# Patient Record
Sex: Female | Born: 1955
Health system: Southern US, Community
[De-identification: ages and names within clinical notes are randomized; demographics above are authoritative.]

## PROBLEM LIST (undated history)

## (undated) DIAGNOSIS — F4310 Post-traumatic stress disorder, unspecified: Secondary | ICD-10-CM

## (undated) DIAGNOSIS — N200 Calculus of kidney: Secondary | ICD-10-CM

## (undated) DIAGNOSIS — F419 Anxiety disorder, unspecified: Secondary | ICD-10-CM

## (undated) DIAGNOSIS — K219 Gastro-esophageal reflux disease without esophagitis: Secondary | ICD-10-CM

## (undated) DIAGNOSIS — F329 Major depressive disorder, single episode, unspecified: Secondary | ICD-10-CM

## (undated) DIAGNOSIS — G43909 Migraine, unspecified, not intractable, without status migrainosus: Secondary | ICD-10-CM

## (undated) DIAGNOSIS — I10 Essential (primary) hypertension: Secondary | ICD-10-CM

## (undated) DIAGNOSIS — J449 Chronic obstructive pulmonary disease, unspecified: Secondary | ICD-10-CM

## (undated) DIAGNOSIS — E119 Type 2 diabetes mellitus without complications: Secondary | ICD-10-CM

## (undated) HISTORY — DX: Post-traumatic stress disorder, unspecified: F43.10

## (undated) HISTORY — PX: KNEE ARTHROSCOPY: SUR90

## (undated) HISTORY — DX: Major depressive disorder, single episode, unspecified: F32.9

## (undated) HISTORY — PX: TONSILLECTOMY: SUR1361

## (undated) HISTORY — PX: CARPAL TUNNEL RELEASE: SHX101

## (undated) HISTORY — DX: Chronic obstructive pulmonary disease, unspecified: J44.9

## (undated) HISTORY — DX: Migraine, unspecified, not intractable, without status migrainosus: G43.909

## (undated) HISTORY — DX: Essential (primary) hypertension: I10

## (undated) HISTORY — DX: Type 2 diabetes mellitus without complications: E11.9

## (undated) HISTORY — PX: BREAST CYST EXCISION: SHX579

## (undated) HISTORY — DX: Anxiety disorder, unspecified: F41.9

## (undated) HISTORY — DX: Gastro-esophageal reflux disease without esophagitis: K21.9

---
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
## 1992-01-03 DIAGNOSIS — K219 Gastro-esophageal reflux disease without esophagitis: Secondary | ICD-10-CM

## 1992-01-03 HISTORY — DX: Gastro-esophageal reflux disease without esophagitis: K21.9

## 1998-07-01 ENCOUNTER — Observation Stay (HOSPITAL_COMMUNITY): Admission: EM | Admit: 1998-07-01 | Discharge: 1998-07-02 | Payer: Self-pay | Admitting: Emergency Medicine

## 1998-07-20 ENCOUNTER — Ambulatory Visit (HOSPITAL_COMMUNITY): Admission: RE | Admit: 1998-07-20 | Discharge: 1998-07-20 | Payer: Self-pay | Admitting: *Deleted

## 1998-07-20 ENCOUNTER — Encounter: Payer: Self-pay | Admitting: *Deleted

## 1998-11-18 ENCOUNTER — Encounter: Payer: Self-pay | Admitting: Obstetrics and Gynecology

## 1998-11-18 ENCOUNTER — Encounter: Admission: RE | Admit: 1998-11-18 | Discharge: 1998-11-18 | Payer: Self-pay | Admitting: Obstetrics and Gynecology

## 1999-10-28 ENCOUNTER — Encounter: Admission: RE | Admit: 1999-10-28 | Discharge: 1999-10-28 | Payer: Self-pay | Admitting: Family Medicine

## 1999-10-28 ENCOUNTER — Encounter: Payer: Self-pay | Admitting: Family Medicine

## 2000-05-29 ENCOUNTER — Encounter: Admission: RE | Admit: 2000-05-29 | Discharge: 2000-05-29 | Payer: Self-pay | Admitting: Family Medicine

## 2000-05-29 ENCOUNTER — Encounter: Payer: Self-pay | Admitting: Family Medicine

## 2000-08-02 ENCOUNTER — Encounter: Payer: Self-pay | Admitting: Obstetrics and Gynecology

## 2000-08-02 ENCOUNTER — Encounter: Admission: RE | Admit: 2000-08-02 | Discharge: 2000-08-02 | Payer: Self-pay | Admitting: Obstetrics and Gynecology

## 2000-12-02 ENCOUNTER — Encounter: Payer: Self-pay | Admitting: Emergency Medicine

## 2000-12-02 ENCOUNTER — Emergency Department (HOSPITAL_COMMUNITY): Admission: EM | Admit: 2000-12-02 | Discharge: 2000-12-03 | Payer: Self-pay | Admitting: Emergency Medicine

## 2002-12-05 ENCOUNTER — Encounter: Admission: RE | Admit: 2002-12-05 | Discharge: 2002-12-05 | Payer: Self-pay | Admitting: Obstetrics and Gynecology

## 2004-01-25 ENCOUNTER — Encounter: Admission: RE | Admit: 2004-01-25 | Discharge: 2004-01-25 | Payer: Self-pay | Admitting: Family Medicine

## 2004-11-08 ENCOUNTER — Other Ambulatory Visit: Admission: RE | Admit: 2004-11-08 | Discharge: 2004-11-08 | Payer: Self-pay | Admitting: Obstetrics and Gynecology

## 2005-01-16 ENCOUNTER — Ambulatory Visit (HOSPITAL_BASED_OUTPATIENT_CLINIC_OR_DEPARTMENT_OTHER): Admission: RE | Admit: 2005-01-16 | Discharge: 2005-01-16 | Payer: Self-pay | Admitting: Urology

## 2005-03-09 ENCOUNTER — Ambulatory Visit (HOSPITAL_BASED_OUTPATIENT_CLINIC_OR_DEPARTMENT_OTHER): Admission: RE | Admit: 2005-03-09 | Discharge: 2005-03-09 | Payer: Self-pay | Admitting: Orthopedic Surgery

## 2005-06-09 ENCOUNTER — Ambulatory Visit (HOSPITAL_BASED_OUTPATIENT_CLINIC_OR_DEPARTMENT_OTHER): Admission: RE | Admit: 2005-06-09 | Discharge: 2005-06-09 | Payer: Self-pay | Admitting: Orthopedic Surgery

## 2005-06-14 ENCOUNTER — Encounter: Admission: RE | Admit: 2005-06-14 | Discharge: 2005-06-14 | Payer: Self-pay | Admitting: Family Medicine

## 2005-11-09 ENCOUNTER — Ambulatory Visit (HOSPITAL_COMMUNITY): Admission: RE | Admit: 2005-11-09 | Discharge: 2005-11-09 | Payer: Self-pay | Admitting: Family Medicine

## 2005-11-27 ENCOUNTER — Ambulatory Visit: Payer: Self-pay | Admitting: Critical Care Medicine

## 2005-12-06 ENCOUNTER — Other Ambulatory Visit: Admission: RE | Admit: 2005-12-06 | Discharge: 2005-12-06 | Payer: Self-pay | Admitting: Obstetrics & Gynecology

## 2005-12-11 ENCOUNTER — Ambulatory Visit: Payer: Self-pay | Admitting: Gastroenterology

## 2005-12-29 ENCOUNTER — Ambulatory Visit: Payer: Self-pay | Admitting: Critical Care Medicine

## 2006-01-02 DIAGNOSIS — F32A Depression, unspecified: Secondary | ICD-10-CM

## 2006-01-02 DIAGNOSIS — G43909 Migraine, unspecified, not intractable, without status migrainosus: Secondary | ICD-10-CM

## 2006-01-02 HISTORY — DX: Depression, unspecified: F32.A

## 2006-01-02 HISTORY — DX: Migraine, unspecified, not intractable, without status migrainosus: G43.909

## 2006-04-06 ENCOUNTER — Ambulatory Visit (HOSPITAL_BASED_OUTPATIENT_CLINIC_OR_DEPARTMENT_OTHER): Admission: RE | Admit: 2006-04-06 | Discharge: 2006-04-06 | Payer: Self-pay | Admitting: Orthopedic Surgery

## 2006-04-09 ENCOUNTER — Ambulatory Visit: Payer: Self-pay | Admitting: Internal Medicine

## 2006-04-19 ENCOUNTER — Encounter (INDEPENDENT_AMBULATORY_CARE_PROVIDER_SITE_OTHER): Payer: Self-pay | Admitting: Specialist

## 2006-04-19 ENCOUNTER — Ambulatory Visit: Payer: Self-pay | Admitting: Internal Medicine

## 2006-11-06 ENCOUNTER — Ambulatory Visit (HOSPITAL_BASED_OUTPATIENT_CLINIC_OR_DEPARTMENT_OTHER): Admission: RE | Admit: 2006-11-06 | Discharge: 2006-11-06 | Payer: Self-pay | Admitting: Orthopedic Surgery

## 2006-11-07 ENCOUNTER — Other Ambulatory Visit: Admission: RE | Admit: 2006-11-07 | Discharge: 2006-11-07 | Payer: Self-pay | Admitting: Obstetrics & Gynecology

## 2007-01-01 ENCOUNTER — Encounter: Admission: RE | Admit: 2007-01-01 | Discharge: 2007-01-01 | Payer: Self-pay | Admitting: Surgery

## 2007-01-09 ENCOUNTER — Encounter: Admission: RE | Admit: 2007-01-09 | Discharge: 2007-01-09 | Payer: Self-pay | Admitting: Obstetrics and Gynecology

## 2007-12-17 ENCOUNTER — Other Ambulatory Visit: Admission: RE | Admit: 2007-12-17 | Discharge: 2007-12-17 | Payer: Self-pay | Admitting: Obstetrics and Gynecology

## 2007-12-24 ENCOUNTER — Encounter: Admission: RE | Admit: 2007-12-24 | Discharge: 2007-12-24 | Payer: Self-pay | Admitting: Obstetrics and Gynecology

## 2008-01-06 ENCOUNTER — Encounter: Admission: RE | Admit: 2008-01-06 | Discharge: 2008-01-06 | Payer: Self-pay | Admitting: Family Medicine

## 2008-01-19 ENCOUNTER — Encounter: Admission: RE | Admit: 2008-01-19 | Discharge: 2008-01-19 | Payer: Self-pay | Admitting: Obstetrics and Gynecology

## 2008-12-19 ENCOUNTER — Emergency Department (HOSPITAL_COMMUNITY): Admission: EM | Admit: 2008-12-19 | Discharge: 2008-12-20 | Payer: Self-pay | Admitting: Emergency Medicine

## 2008-12-20 ENCOUNTER — Emergency Department (HOSPITAL_COMMUNITY): Admission: EM | Admit: 2008-12-20 | Discharge: 2008-12-21 | Payer: Self-pay | Admitting: Emergency Medicine

## 2009-01-02 DIAGNOSIS — J449 Chronic obstructive pulmonary disease, unspecified: Secondary | ICD-10-CM

## 2009-01-02 HISTORY — DX: Chronic obstructive pulmonary disease, unspecified: J44.9

## 2009-01-06 ENCOUNTER — Emergency Department (HOSPITAL_COMMUNITY): Admission: EM | Admit: 2009-01-06 | Discharge: 2009-01-06 | Payer: Self-pay | Admitting: Emergency Medicine

## 2009-01-07 ENCOUNTER — Encounter: Admission: RE | Admit: 2009-01-07 | Discharge: 2009-01-07 | Payer: Self-pay | Admitting: Family Medicine

## 2009-01-13 ENCOUNTER — Emergency Department (HOSPITAL_COMMUNITY): Admission: EM | Admit: 2009-01-13 | Discharge: 2009-01-13 | Payer: Self-pay | Admitting: Emergency Medicine

## 2009-04-07 ENCOUNTER — Ambulatory Visit: Payer: Self-pay | Admitting: Family Medicine

## 2009-04-07 LAB — CONVERTED CEMR LAB
ALT: 27 units/L (ref 0–35)
AST: 23 units/L (ref 0–37)
Albumin: 4.4 g/dL (ref 3.5–5.2)
Alkaline Phosphatase: 83 units/L (ref 39–117)
BUN: 13 mg/dL (ref 6–23)
Basophils Absolute: 0 10*3/uL (ref 0.0–0.1)
Basophils Relative: 0 % (ref 0–1)
CO2: 27 meq/L (ref 19–32)
Calcium: 9.7 mg/dL (ref 8.4–10.5)
Chloride: 102 meq/L (ref 96–112)
Cholesterol: 186 mg/dL (ref 0–200)
Creatinine, Ser: 0.55 mg/dL (ref 0.40–1.20)
Eosinophils Absolute: 0.3 10*3/uL (ref 0.0–0.7)
Eosinophils Relative: 3 % (ref 0–5)
Glucose, Bld: 82 mg/dL (ref 70–99)
HCT: 44.5 % (ref 36.0–46.0)
HDL: 35 mg/dL — ABNORMAL LOW (ref >39)
Hemoglobin: 14.5 g/dL (ref 12.0–15.0)
LDL Cholesterol: 101 mg/dL — ABNORMAL HIGH (ref 0–99)
Lymphocytes Relative: 31 % (ref 12–46)
Lymphs Abs: 3.9 10*3/uL (ref 0.7–4.0)
MCHC: 32.6 g/dL (ref 30.0–36.0)
MCV: 98.2 fL (ref 78.0–100.0)
Monocytes Absolute: 0.8 10*3/uL (ref 0.1–1.0)
Monocytes Relative: 6 % (ref 3–12)
Neutro Abs: 7.7 10*3/uL (ref 1.7–7.7)
Neutrophils Relative %: 60 % (ref 43–77)
Platelets: 326 10*3/uL (ref 150–400)
Potassium: 4.4 meq/L (ref 3.5–5.3)
RBC: 4.53 M/uL (ref 3.87–5.11)
RDW: 14.2 % (ref 11.5–15.5)
Sodium: 143 meq/L (ref 135–145)
TSH: 1.615 microintl units/mL (ref 0.350–4.500)
Total Bilirubin: 0.5 mg/dL (ref 0.3–1.2)
Total CHOL/HDL Ratio: 5.3
Total Protein: 7.2 g/dL (ref 6.0–8.3)
Triglycerides: 250 mg/dL — ABNORMAL HIGH (ref <150)
VLDL: 50 mg/dL — ABNORMAL HIGH (ref 0–40)
Vit D, 25-Hydroxy: 39 ng/mL (ref 30–89)
WBC: 12.7 10*3/uL — ABNORMAL HIGH (ref 4.0–10.5)

## 2009-04-26 ENCOUNTER — Ambulatory Visit: Payer: Self-pay | Admitting: Internal Medicine

## 2009-04-28 ENCOUNTER — Ambulatory Visit: Payer: Self-pay | Admitting: Internal Medicine

## 2009-05-06 ENCOUNTER — Ambulatory Visit (HOSPITAL_COMMUNITY): Admission: RE | Admit: 2009-05-06 | Discharge: 2009-05-06 | Payer: Self-pay | Admitting: Family Medicine

## 2009-05-24 ENCOUNTER — Ambulatory Visit: Payer: Self-pay | Admitting: Internal Medicine

## 2009-06-08 ENCOUNTER — Ambulatory Visit: Payer: Self-pay | Admitting: Internal Medicine

## 2009-06-23 ENCOUNTER — Ambulatory Visit (HOSPITAL_COMMUNITY): Admission: RE | Admit: 2009-06-23 | Discharge: 2009-06-23 | Payer: Self-pay | Admitting: Family Medicine

## 2009-07-13 ENCOUNTER — Ambulatory Visit: Payer: Self-pay | Admitting: Family Medicine

## 2009-07-13 ENCOUNTER — Ambulatory Visit: Payer: Self-pay | Admitting: Internal Medicine

## 2009-08-10 ENCOUNTER — Ambulatory Visit: Payer: Self-pay | Admitting: Internal Medicine

## 2009-12-18 ENCOUNTER — Emergency Department (HOSPITAL_COMMUNITY)
Admission: EM | Admit: 2009-12-18 | Discharge: 2009-12-18 | Payer: Self-pay | Source: Home / Self Care | Admitting: Emergency Medicine

## 2010-01-02 HISTORY — PX: KNEE SURGERY: SHX244

## 2010-01-08 ENCOUNTER — Emergency Department (HOSPITAL_COMMUNITY)
Admission: EM | Admit: 2010-01-08 | Discharge: 2010-01-08 | Payer: Self-pay | Source: Home / Self Care | Admitting: Emergency Medicine

## 2010-01-15 ENCOUNTER — Encounter
Admission: RE | Admit: 2010-01-15 | Discharge: 2010-01-15 | Payer: Self-pay | Source: Home / Self Care | Attending: Orthopedic Surgery | Admitting: Orthopedic Surgery

## 2010-02-01 LAB — URINALYSIS, ROUTINE W REFLEX MICROSCOPIC
Bilirubin Urine: NEGATIVE
Hgb urine dipstick: NEGATIVE
Ketones, ur: NEGATIVE mg/dL
Nitrite: NEGATIVE
Protein, ur: NEGATIVE mg/dL
Specific Gravity, Urine: 1.008 (ref 1.005–1.030)
Urine Glucose, Fasting: NEGATIVE mg/dL
Urobilinogen, UA: 0.2 mg/dL (ref 0.0–1.0)
pH: 6 (ref 5.0–8.0)

## 2010-02-01 LAB — CBC
HCT: 45.5 % (ref 36.0–46.0)
Hemoglobin: 15 g/dL (ref 12.0–15.0)
MCH: 31 pg (ref 26.0–34.0)
MCHC: 33 g/dL (ref 30.0–36.0)
MCV: 94 fL (ref 78.0–100.0)
Platelets: 349 10*3/uL (ref 150–400)
RBC: 4.84 MIL/uL (ref 3.87–5.11)
RDW: 12.8 % (ref 11.5–15.5)
WBC: 11.8 10*3/uL — ABNORMAL HIGH (ref 4.0–10.5)

## 2010-02-01 LAB — DIFFERENTIAL
Basophils Absolute: 0 10*3/uL (ref 0.0–0.1)
Basophils Relative: 0 % (ref 0–1)
Eosinophils Absolute: 0.3 10*3/uL (ref 0.0–0.7)
Eosinophils Relative: 3 % (ref 0–5)
Lymphocytes Relative: 30 % (ref 12–46)
Lymphs Abs: 3.5 10*3/uL (ref 0.7–4.0)
Monocytes Absolute: 1.2 10*3/uL — ABNORMAL HIGH (ref 0.1–1.0)
Monocytes Relative: 10 % (ref 3–12)
Neutro Abs: 6.7 10*3/uL (ref 1.7–7.7)
Neutrophils Relative %: 57 % (ref 43–77)

## 2010-02-01 LAB — BASIC METABOLIC PANEL
BUN: 6 mg/dL (ref 6–23)
CO2: 29 mEq/L (ref 19–32)
Calcium: 9.7 mg/dL (ref 8.4–10.5)
Chloride: 101 mEq/L (ref 96–112)
Creatinine, Ser: 0.61 mg/dL (ref 0.4–1.2)
GFR calc Af Amer: >60 mL/min (ref >60)
GFR calc non Af Amer: >60 mL/min (ref >60)
Glucose, Bld: 108 mg/dL — ABNORMAL HIGH (ref 70–99)
Potassium: 3.8 mEq/L (ref 3.5–5.1)
Sodium: 139 mEq/L (ref 135–145)

## 2010-02-01 LAB — SURGICAL PCR SCREEN
MRSA, PCR: NEGATIVE
Staphylococcus aureus: NEGATIVE

## 2010-02-01 LAB — PROTIME-INR
INR: 0.98 (ref 0.00–1.49)
Prothrombin Time: 13.2 seconds (ref 11.6–15.2)

## 2010-02-01 LAB — APTT: aPTT: 42 seconds — ABNORMAL HIGH (ref 24–37)

## 2010-02-02 ENCOUNTER — Observation Stay (HOSPITAL_COMMUNITY)
Admission: RE | Admit: 2010-02-02 | Discharge: 2010-02-02 | Disposition: A | Discharge status: Home / Self Care | Payer: Self-pay | Attending: Orthopedic Surgery | Admitting: Orthopedic Surgery

## 2010-02-02 DIAGNOSIS — R9431 Abnormal electrocardiogram [ECG] [EKG]: Secondary | ICD-10-CM | POA: Insufficient documentation

## 2010-02-02 DIAGNOSIS — M2240 Chondromalacia patellae, unspecified knee: Principal | ICD-10-CM | POA: Insufficient documentation

## 2010-02-02 DIAGNOSIS — M6580 Other synovitis and tenosynovitis, unspecified site: Secondary | ICD-10-CM | POA: Insufficient documentation

## 2010-02-02 DIAGNOSIS — J45909 Unspecified asthma, uncomplicated: Secondary | ICD-10-CM | POA: Insufficient documentation

## 2010-02-04 ENCOUNTER — Ambulatory Visit (HOSPITAL_COMMUNITY): Admission: RE | Admit: 2010-02-04 | Payer: Self-pay | Source: Ambulatory Visit | Admitting: Orthopedic Surgery

## 2010-02-11 ENCOUNTER — Ambulatory Visit (HOSPITAL_COMMUNITY)
Admission: RE | Admit: 2010-02-11 | Discharge: 2010-02-11 | Disposition: A | Discharge status: Home / Self Care | Payer: Medicaid Other | Source: Ambulatory Visit | Attending: Orthopedic Surgery | Admitting: Orthopedic Surgery

## 2010-02-11 DIAGNOSIS — M79609 Pain in unspecified limb: Secondary | ICD-10-CM | POA: Insufficient documentation

## 2010-02-11 DIAGNOSIS — M7989 Other specified soft tissue disorders: Secondary | ICD-10-CM | POA: Insufficient documentation

## 2010-02-15 NOTE — Op Note (Signed)
Marie [name], Marie [name] NO.:  [id number]  MEDICAL RECORD NO.:  [id number]           PATIENT TYPE:  I  LOCATION:  0098                         FACILITY:  [facility]  PHYSICIAN:  Almedia Balls. Ranell Patrick, M.D. DATE OF BIRTH:  Jun 29, 1955  DATE OF PROCEDURE:  02/02/2010 DATE OF DISCHARGE:                              OPERATIVE REPORT   PREOPERATIVE DIAGNOSIS:  Right knee medial meniscus tear.  POSTOPERATIVE DIAGNOSIS:  Right knee chondromalacia as well as synovitis.  PROCEDURE PERFORMED:  Arthroscopy, chondroplasty as well as partial synovectomy.  SURGEON:  Almedia Balls. Ranell Patrick, M.D.  ASSISTANT:  None.  ANESTHESIA:  LMA, general anesthesia was used.  ESTIMATED BLOOD LOSS:  Minimal.  FLUID REPLACEMENT:  1000 mL crystalloid.  INSTRUMENT COUNTS:  Correct.  COMPLICATIONS:  None.  Preoperative antibiotics were given.  INDICATIONS:  The patient is a 54-old-female with suspected right medial meniscus tear.  The patient has had persistent medial knee pain and clinical findings consistent with medial meniscus tear.  MRI scan equivocal but suggestive of meniscal tear and the patient has failed all modes of conservative management, presents for operative treatment. Informed consent obtained.  DESCRIPTION OF PROCEDURE:  After an adequate local anesthesia achieved, the patient positioned supine on the operating table.  Right leg correctly identified. A lateral post was utilized.  After sterile prep and drape of the right knee, we introduced general arthroscopic portals including superolateral outflow, anterolateral scope and anteromedial working portals.  We identified a significant synovitis within the knee present throughout the entire knee joint.  There was some central chondromalacia on the patellar side grade 3 with loose flaps and fibrillation of cartilage.  We performed a chondroplasty smoothing down the cartilage to smooth articular cartilage using tangential  technique. We did have to do fair amount of synovectomy to basically decompress the medial and lateral gutters making sure that we could see the entire medial femoral condyle.  The joint line tenderness was significant.  We did inspect the medial compartment.  There was no meniscal tear noted as visualized both from medial and lateral portals.  We inspected both the tibial and femoral surfaces of the meniscus and it was tacked at the meniscal synovial junction, also on the underside of meniscus as we were able to lift it up using a probe.  The articular cartilage in the deep flexion was normal both on the tibia and femur.  The ACL, PCL intact with quite of bit synovitis present notch.  Again, we just removed that so we could see the meniscal roots closest to the notch.  The lateral meniscus was pristine and some meniscal tear and the articular cartilage brought in the normal, both visually and by palpation.  Following complete inspection of the knee including medial lateral gutters up in the parapatellar areas, and again partial synovectomy, essentially feels all articular surfaces and completion of the chondroplasty in the patellofemoral joint which included surgery suturing with 4-0 Monocryl followed by Steri-Strips and sterile compressive bandage.  The patient was given some Toradol prior to waking up 30 IV x1 and also given an injection with Marcaine at the end of surgery for pain  relief and we did make 1 cc of Kenalog 40 into the knee joint and that helped her as well with inflammation postop.  I am going to give her some oral Toradol to go home with and she will be following up in the office in about 1 week.     Almedia Balls. Ranell Patrick, M.D.     SRN/MEDQ  D:  02/02/2010  T:  02/02/2010  Job:  [id number]  Electronically Signed by Malon Kindle  on 02/15/2010 11:29:06 AM

## 2010-05-13 NOTE — Discharge Summary (Signed)
  Marie [name].:  [id number]  MEDICAL RECORD [name].:  [id number]           PATIENT TYPE:  I  LOCATION:  0098                         FACILITY:  [facility]  PHYSICIAN:  Almedia Balls. Ranell Patrick, M.D. DATE OF BIRTH:  [birth year]  DATE OF ADMISSION:  02/02/2010 DATE OF DISCHARGE:  02/02/2010                              DISCHARGE SUMMARY   ADMITTING DIAGNOSIS:  Right knee medial meniscus tear.  DISCHARGE DIAGNOSIS:  Right knee chondromalacia and synovitis.  PROCEDURE PERFORMED:  Right knee arthroscopy with arthroscopic chondroplasty and partial synovectomy, was performed on February 02, 2010.  CONSULTING SERVICES:  Physical Therapy.  HISTORY OF PRESENT ILLNESS:  The patient is a 55 year old female with suspected right knee medial meniscus tear with medial joint line pain, which has failed all measures of conservative management, presents to hospital for right knee arthroscopy.  For further details of the patient's past medical history and physical examination, please see the hospital record.  HOSPITAL COURSE:  The patient admitted to Orthopedics on February 02, 2010, where she was taken to the operating theater and underwent a right knee arthroscopy.  The patient tolerated the procedure well, was scheduled for discharge.  She is discharged in improved condition on a regular diet with her pain medications of Percocet and Robaxin, with followup in Orthopedics in 10-14 days.  She is instructed to ice her knee.  She is instructed wear her antiembolism stockings, perform exercises daily at least every 2 hours designed to promote blood flow and decrease the risk of blood clots, and I will be seen her back in 10- 14 days.     Almedia Balls. Ranell Patrick, M.D.     SRN/MEDQ  D:  04/27/2010  T:  04/28/2010  Job:  [id number]  Electronically Signed by Malon Kindle  on 05/13/2010 04:27:55 PM

## 2010-05-14 ENCOUNTER — Emergency Department (HOSPITAL_COMMUNITY): Payer: Medicaid Other

## 2010-05-14 ENCOUNTER — Emergency Department (HOSPITAL_COMMUNITY)
Admission: EM | Admit: 2010-05-14 | Discharge: 2010-05-14 | Disposition: A | Discharge status: Home / Self Care | Payer: Medicaid Other | Attending: Emergency Medicine | Admitting: Emergency Medicine

## 2010-05-14 DIAGNOSIS — IMO0002 Reserved for concepts with insufficient information to code with codable children: Secondary | ICD-10-CM | POA: Insufficient documentation

## 2010-05-14 DIAGNOSIS — R079 Chest pain, unspecified: Secondary | ICD-10-CM | POA: Insufficient documentation

## 2010-05-14 DIAGNOSIS — W010XXA Fall on same level from slipping, tripping and stumbling without subsequent striking against object, initial encounter: Secondary | ICD-10-CM | POA: Insufficient documentation

## 2010-05-14 DIAGNOSIS — Y93K1 Activity, walking an animal: Secondary | ICD-10-CM | POA: Insufficient documentation

## 2010-05-14 DIAGNOSIS — I10 Essential (primary) hypertension: Secondary | ICD-10-CM | POA: Insufficient documentation

## 2010-05-14 DIAGNOSIS — Y92009 Unspecified place in unspecified non-institutional (private) residence as the place of occurrence of the external cause: Secondary | ICD-10-CM | POA: Insufficient documentation

## 2010-05-14 DIAGNOSIS — E7800 Pure hypercholesterolemia, unspecified: Secondary | ICD-10-CM | POA: Insufficient documentation

## 2010-05-17 NOTE — Op Note (Signed)
NAME:  Marie [name], Marie [name]            ACCOUNT NO.:  [id number]   MEDICAL RECORD NO.:  [id number]          PATIENT TYPE:  AMB   LOCATION:  DSC                          FACILITY:  [facility]   PHYSICIAN:  Katy Fitch. Sypher, M.D. DATE OF BIRTH:  1955-07-13   DATE OF PROCEDURE:  11/06/2006  DATE OF DISCHARGE:                               OPERATIVE REPORT   PREOPERATIVE DIAGNOSES:  1. Chronic stenosing tenosynovitis, left thumb.  2. Chronic stenosing tenosynovitis, left ring finger at A1 pulley with      ganglion cyst formation at left ring finger A1 pulley.   POSTOPERATIVE DIAGNOSES:  1. Chronic stenosing tenosynovitis, left thumb.  2. Chronic stenosing tenosynovitis, left ring finger at A1 pulley with      ganglion cyst formation at left ring finger A1 pulley.   OPERATIONS:  1. Release of left thumb A1 pulley  2. Release of left ring finger A1 pulley.   OPERATING SURGEON:  Katy Fitch. Sypher, M.D.   ASSISTANT:  Molly Maduro Dasnoit PA-C.   ANESTHESIA:  Sedation and 2% lidocaine metacarpal head-level block of  left thumb and left ring finger, supervising anesthesiologist is Guadalupe Maple, M.D.   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  She has been managed for stenosing  tenosynovitis in the past.  She now presents for surgical treatment of  her left thumb and left ring finger stenosing tenosynovitis.   We have followed her clinically for several months.  She has failed  nonoperative measures.   Preoperatively she was advised of the potential risks and benefits of  surgery.  She was noted to have significant synovitis and a probable  small cyst forming on her left ring finger A1 pulley.  After informed  consent, she is brought to the operating room at this time.   PROCEDURE:  Marie [name] is brought to the operating room and placed  in supine position on the operating table.   Following light sedation the left arm  was prepped with Betadine soap and  solution and sterilely draped.  A pneumatic tourniquet was applied to  the proximal left brachium.   When sedation was adequate 2% lidocaine was infiltrated at the  metacarpal head level of the left thumb and left ring finger.   The arm was then prepped with Betadine soap and solution and sterilely  draped.  After exsanguination of the left arm with an Esmarch bandage,  the arterial tourniquet was inflated 250 mmHg.   The procedure commenced with a short transverse incision at the thumb  proximal flexion crease.  The subcutaneous tissues were carefully  divided, taking care to identify the A1 pulley.  The radial proper  digital nerve was gently retracted.  The A1 pulley was then split with  scalpel and scissors.  The tendon was delivered and found to be  edematous.   Thereafter full IP range of motion was recovered.   The wound was repaired with mattress suture of 5-0 nylon.   Attention was then directed to the ring finger.   A short transverse incision was  fashioned distal to the distal palmar  crease.  The subcutaneous tissues were carefully divided taking care to  identify and release the pretendinous fibers of the palmar fascia with  scissors.  The A1 pulley was isolated, blunt retractors placed to  protect neurovascular structures.  The pulley had some granulation  tissue present on its surface rather than a ganglion cyst.  The pulley  was split longitudinally along its radial border.  The release was  extended proximally and distally including a small fascial band proximal  to the A1 pulley.   The tendons were delivered and hypertrophic synovium removed with a  rongeur.   Thereafter full range of motion of the ring finger was recovered  apparent.   The ring finger wound was repaired with mattress suture of 5-0 nylon.   The wound was then dressed with Xeroflo, sterile gauze and an Ace wrap  dressing.  For aftercare Ms. Banales will  begin immediate range of  motion excises.  She is provided a prescription for Dilaudid 2 mg one  p.o. q.4-6h. p.r.n. pain, 16 tablets without refill.   We will see her back in follow-up in 1 week in the office for follow-up  evaluation and management.      [name], M.D.  Electronically Signed     RVS/MEDQ  D:  11/06/2006  T:  11/07/2006  Job:  [id number]

## 2010-05-19 ENCOUNTER — Emergency Department (HOSPITAL_COMMUNITY)
Admission: EM | Admit: 2010-05-19 | Discharge: 2010-05-20 | Disposition: A | Discharge status: Home / Self Care | Payer: Medicaid Other | Attending: Emergency Medicine | Admitting: Emergency Medicine

## 2010-05-19 DIAGNOSIS — IMO0002 Reserved for concepts with insufficient information to code with codable children: Secondary | ICD-10-CM | POA: Insufficient documentation

## 2010-05-19 DIAGNOSIS — E7800 Pure hypercholesterolemia, unspecified: Secondary | ICD-10-CM | POA: Insufficient documentation

## 2010-05-19 DIAGNOSIS — M25519 Pain in unspecified shoulder: Secondary | ICD-10-CM | POA: Insufficient documentation

## 2010-05-19 DIAGNOSIS — I10 Essential (primary) hypertension: Secondary | ICD-10-CM | POA: Insufficient documentation

## 2010-05-19 DIAGNOSIS — S20219A Contusion of unspecified front wall of thorax, initial encounter: Secondary | ICD-10-CM | POA: Insufficient documentation

## 2010-05-19 DIAGNOSIS — R0789 Other chest pain: Secondary | ICD-10-CM | POA: Insufficient documentation

## 2010-05-19 DIAGNOSIS — W19XXXA Unspecified fall, initial encounter: Secondary | ICD-10-CM | POA: Insufficient documentation

## 2010-05-20 NOTE — Assessment & Plan Note (Signed)
[facility]                             PULMONARY OFFICE NOTE   NAME:[name] [name] MCCURLEY                   MRN:          [id number]  DATE:11/27/2005                            DOB:          02/05/1955    CHIEF COMPLAINT:  Incessant cough.   HISTORY OF PRESENT ILLNESS:  This is a 55 year old white female who has  had continuous cough starting a year ago. She was diagnosed with  pneumonia in January 2007 at Urgent Care.  The patient cough did not  stop after that.  She fell in June 2007, hitting the right side.  X-rays  were negative.  Since that time the cough continues.  She has wheezing  at night, particularly when she exhales.  She is hoarse in the  afternoon.  She chokes easily.  She has no sore throat.  She has  constant heartburn and is only on Tums.  She has had no real chest  tightness, pain in the left side.  Her weight is up to 229 pounds and  she keeps growing.  She chokes easily.  Her typical diet includes that  of two bagels in the morning, a salad for lunch and cereal for dinner.  The patient has lots of anxiety and depression.  She continues to smoke  a pack a day and has done so for 23 years.  She is referred for further  evaluation.   PAST MEDICAL HISTORY:  1. History of hypercholesterolemia.  2. Bladder surgery in January 2007.  3. Bilateral carpal tunnel, 2007.  4. No other surgeries.  5. No other medical problems.   SOCIAL HISTORY:  Smoker as noted above.  She is divorced.  Has children.  Works as a Wellsite geologist and a Office manager.   FAMILY HISTORY:  Heart disease in a maternal grandfather and maternal  grandmother and prostate cancer in maternal grandfather.  Emphysema in  maternal grandfather.   REVIEW OF SYSTEMS:  Otherwise noncontributory except for depression and  anxiety.   CURRENT MEDICATIONS:  1. Atenolol 50 mg daily.  2. Fish oil daily.  3. Vitamins daily.  4. Vytorin 10/40 daily.   PHYSICAL  EXAMINATION:  VITAL SIGNS:  Weight 229 pounds.  Temperature 98.  Blood pressure 144/86, pulse 83, saturation 94% on room air.  CHEST:  Completely clear without evidence of wheeze, rale or rhonchi.  There was some distant breath sounds noted.  CARDIAC:  Regular rate and rhythm without S3.  Normal S1 and S2.  ABDOMEN:  Soft, nontender.  EXTREMITIES:  No clubbing or edema or venous disease.  SKIN:  Clear.  NEUROLOGIC:  Intact.  HEENT:  Nares clear.  Oropharynx clear.  NECK:  Supple. No jugular venous distension.   LABORATORY DATA:  Pulmonary functions obtained on November 09, 2005 shows  peripheral air flow obstruction.  FEF 25/75 at 79% predicted.  FEV1 at  87% predicted.   Chest x-ray showed peribronchial thickening.   IMPRESSION:  1. Vocal cord dysfunction syndrome.  2. Reflux disease aggravating upper airway dysfunction.  3. Active smoking but chronic bronchitis and cyclic cough.   RECOMMENDATIONS:  Cyclic cough.  Protocol with Tussionex p.r.n.  Reflux  treatment with Zegerid 40 mg daily.  Reflux diet was advised.  Weight  loss is advised.  Dietary, __________ behavioral changes were advised.  Discontinuance of cigarettes was advised and Symbicort 80/4.5 two sprays  b.i.d. initiated.   PLAN:  Return in six weeks.     Charlcie Cradle Delford Field, MD, [facility]  Electronically Signed    PEW/MedQ  DD: 11/29/2005  DT: 11/29/2005  Job #: [id number]   cc:   [name] [name], M.D.

## 2010-05-20 NOTE — Op Note (Signed)
NAME:  Marie [name], Marie [name]            ACCOUNT NO.:  [id number]   MEDICAL RECORD NO.:  [id number]          PATIENT TYPE:  AMB   LOCATION:  DSC                          FACILITY:  [facility]   PHYSICIAN:  Katy Fitch. Sypher, M.D. DATE OF BIRTH:  1955-01-13   DATE OF PROCEDURE:  03/09/2005  DATE OF DISCHARGE:                                 OPERATIVE REPORT   PREOPERATIVE DIAGNOSIS:  Entrapment neuropathy median nerve right carpal  tunnel.   POSTOPERATIVE DIAGNOSIS:  Entrapment neuropathy median nerve right carpal  tunnel.   OPERATIONS:  Release of right transcarpal ligament.   SURGEON:  Katy Fitch. Sypher, M.D.   ASSISTANT:  Marveen Reeks. Dasnoit, P.A.-C.   ANESTHESIA:  General by LMA.   ANESTHESIOLOGIST:  Zenon Mayo, M.D.   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  Prior clinical examination confirmed signs of  carpal tunnel syndrome and electrodiagnostic studies confirmed moderately  severe right carpal tunnel syndrome.  Due to a failure to respond to  nonoperative measures, she is brought to the operating room at this time for  release of her right transverse carpal ligament.   DESCRIPTION OF PROCEDURE:  [name] is brought to the operating room  and placed in supine position on the operating table.  Following an  anesthesia consultation by Dr. Sampson Goon, general anesthesia by LMA was  selected.  Following induction of general anesthesia under the direct  supervision of Dr. Sampson Goon, the right arm was prepped with Betadine soap  solution and sterilely draped.  A pneumatic tourniquet was applied proximal  right brachium.  Following exsanguination of the right arm with an Esmarch  bandage, the arterial tourniquet was inflated to 220 mmHg.  The procedure  commenced with a short incision in the line of the ring finger in the palm.  The  subcutaneous tissue were carefully divided revealing the palmar fascia.  This was split longitudinally to reveal the common sensory branch of the  median nerve.  The median nerve was gently isolated from the transcarpal  ligament with a Penfield #4 elevator followed by release of the transcarpal  ligament with scissors along its ulnar border extending into the distal  forearm.  This widely opened the carpal canal.  No masses or other  predicaments are noted.  Bleeding points along the margin of the released  ligament were electrocauterized with bipolar current followed by repair of  the skin with intradermal through Prolene suture.  A compressive dressing  was applied with a volar plaster splint maintaining the wrist in 5 degrees  of dorsiflexion.  After [name] was awakened from her general  anesthesia and transferred to recovery room, she was noted have stable vital  signs.   For aftercare, she is provided a prescription for Percocet 5 mg tablets, 1  p.o. q. 4-6h. P.r.n. pain, 20 tablets without refill.  She will return to  our office for follow up in seven days for dressing change and initiation of  an excise program.  [name], M.D.  Electronically Signed     RVS/MEDQ  D:  03/09/2005  T:  03/09/2005  Job:  [id number]

## 2010-05-20 NOTE — Op Note (Signed)
NAME:  Marie [name], Marie [name]            ACCOUNT NO.:  [id number]   MEDICAL RECORD NO.:  [id number]          PATIENT TYPE:  AMB   LOCATION:  DSC                          FACILITY:  [facility]   PHYSICIAN:  Katy Fitch. Sypher, M.D. DATE OF BIRTH:  Apr 06, 1955   DATE OF PROCEDURE:  04/06/2006  DATE OF DISCHARGE:                               OPERATIVE REPORT   PREOPERATIVE DIAGNOSIS:  Chronic stenosing tenosynovitis, right thumb at  A1 pulley.   POSTOPERATIVE DIAGNOSIS:  Chronic stenosing tenosynovitis, right thumb  at A1 pulley.   OPERATION:  Release of right thumb A1 pulley.   OPERATIONS:  Katy Fitch. Sypher, M.D.   ASSISTANT:  Marveen Reeks. Dasnoit, P.A.-C.   ANESTHESIA:  Is 2% lidocaine metacarpal head level block, right thumb,  supplemented by IV sedation.   SUPERVISING ANESTHESIOLOGIST:  Janetta Hora. Gelene Mink, M.D.   INDICATIONS:  Marie [name] is a 55 year old woman who was referred  for evaluation and management of a locking and painful right thumb.  Clinical examination suggested mild osteoarthrosis.  Exam revealed  incidental enchondroma of her right long finger proximal phalanx.  There  was no sign of a pathologic fracture.  She had active triggering of her  right thumb at the A1 pulley.   Due to failure to respond to nonoperative measures, she is brought to  the operating room at this time for release of her right thumb A1  pulley.   PROCEDURE:  Marie [name] was brought to the operating room and  placed in supine position on the operating table.  Following light  sedation, the right arm was prepped with Betadine soap and solution and  sterilely draped.  2% lidocaine was infiltrated into the path of the  intended incision.  When anesthesia was satisfactory, the procedure  commenced with a short transverse incision directly over the palpably  thickened right thumb A1 pulley.   The subcutaneous tissues were gently dissected with scissors, exposing  the pulley.  A Freer  elevator was used to clear all the inflammatory  soft tissues off of the annular pulley.  A blunt retractor was used to  retract the radial proper digital nerve.  The pulley was then split with  scalpel and scissors.  Thereafter, full active range of motion of the IP  joint was recovered.   The wound was repaired with interrupted suture of 5-0 nylon.  A  compressive dressing was applied with Xeroflo sterile gauze and an Ace  wrap.   We will encourage Ms. Enriques to elevate her hand above her heart for  the next three days.  She will work on range of motion exercises.  She  will return to see [place] for followup in a week for suture removal.      Katy Fitch. Sypher, M.D.  Electronically Signed     RVS/MEDQ  D:  04/06/2006  T:  04/06/2006  Job:  [id number]

## 2010-05-20 NOTE — Op Note (Signed)
NAME:  [name], [name] NO.:  [id number]   MEDICAL RECORD NO.:  [id number]          PATIENT TYPE:  AMB   LOCATION:  [facility]                         FACILITY:  [facility]   PHYSICIAN:  Bertram Millard. Dahlstedt, M.D.DATE OF BIRTH:  1955-02-14   DATE OF PROCEDURE:  01/16/2005  DATE OF DISCHARGE:                                 OPERATIVE REPORT   PREOPERATIVE DIAGNOSIS:  Typical stress urinary incontinence.   POSTOPERATIVE DIAGNOSIS:  Typical stress urinary incontinence.   PROCEDURE:  Pubovaginal sling using Lynx suprapubic system.   SURGEON:  Bertram Millard. Dahlstedt, M.D.   ANESTHESIA:  General.   COMPLICATIONS:  None.   BRIEF HISTORY:  This 55 year old female initially presented in November of  this year with stress incontinence. This had been worsening over the past  year and is fairly severe to the patient. She wears four to six pads a day  because of the leakage. We have talked about alternatives to surgery, and  she was placed on anticholinergic medications and Kegels. Despite these, she  still leaks significantly and would like to proceed with surgical  management. Risks and complications of a pubovaginal sling had been  discussed with the patient. These include but are not limited to infection,  erosion, need for revision, retention, bleeding, among others. She  understands these and desires to proceed.   DESCRIPTION OF PROCEDURE:  Marie [name] was identified in the preoperative area  and was administered preoperative IV antibiotics. She was taken to the  operating room where general anesthetic was administered. She was placed in  the dorsal lithotomy position. Genitalia, perineum and lower abdomen were  prepped and draped. Two small incisions were made in the suprapubic area on  either side of the midline. The bladder was then catheterized and the  bladder drained. Ten mL of water was placed in the balloon. Anterior vaginal  wall was infiltrated with 1% lidocaine with  epinephrine. An incision was  made overlying the urethra vertically in the anterior vaginal wall. This was  dissected bilaterally using scissors. Dissection was carried to pubocervical  fascia bilaterally. The suprapubic needles were then passed from the  suprapubic incisions and guided down behind the pubis bilaterally. Each of  these needles were passed just to each side of the midline. The urethra was  not injured with either of these passes, and the bladder was inspected and  no injury was seen. The [place] sling was then grasped bilaterally and pulled  so that it formed a hammock underneath the mid urethra. Once adequate  positioning was achieved (there was enough slack to admit a right angle  clamp between the urethra and the sling), the sheath was then cut and  removed. Good support with a minimal amount of slack was present at this  point. The mesh was then cut just beneath the incisions bilaterally and  allowed to fall into subcutaneous tissues. Dermabond was placed. The vaginal  incision was irrigated with antibiotic solution. It was then closed quite  loosely over top of the sling material with a running 2-0 Vicryl. In this  manner, there was excellent apposition of the vaginal  mucosa overlying the  sling. About 200 mL of water was left in the bladder and the catheter  removed. No vaginal packing was placed.   The patient tolerated the procedure well. Estimated blood loss was 50 mL.  She was awakened and taken to PACU in stable condition.      Bertram Millard. Dahlstedt, M.D.  Electronically Signed     SMD/MEDQ  D:  01/16/2005  T:  01/17/2005  Job:  [id number]   cc:   Edwena Felty. Romine, M.D.  Fax: [phone]

## 2010-05-20 NOTE — Assessment & Plan Note (Signed)
[facility]                             PULMONARY OFFICE NOTE   NAME:Blankenbaker, JACKI COUSE                   MRN:          [id number]  DATE:12/29/2005                            DOB:          [birth year]    Ms. Haltiwanger returns today in followup.  This is a 55 year old with  persistent cough.  Our feeling was that she had a combination of  asthmatic bronchitis, vocal cord dysfunction syndrome, and reflux  disease.  She is still actively smoking a pack a day of cigarettes.  Coughing is persisting.  She is not sure the Symbicort at two sprays  b.i.d. 80/4.5 was beneficial.  She never filled the Chantix.  She is  maintaining Zegerid 40 mg daily.  She does continue to use fish oil  daily along with atenolol 50 mg daily, Vytorin daily, vitamins daily.   On exam, temperature 98, blood pressure 112/74, pulse 70, saturation 97%  in room air.  Chest showed distant breath sounds, no active wheezing.  Cardiac exam showed a regular rate and rhythm without S3, normal S1 and  S2.  Abdomen was protuberant, bowel sounds were active.  Extremities  showed no edema or clubbing, skin was clear, neurologic exam was intact.  HEENT exam showed no jugular venous distention or lymphadenopathy,  oropharynx clear, neck supple.   Impression here would be that of cyclic cough syndrome with lower airway  inflammation aggravated by ongoing smoking use.  The plan for this  patient is to restart Symbicort at two sprays b.i.d. 180/4.5.  The  patient will also pursue smoking cessation with the Chantix product.  She will continue Zegerid 40 mg daily, discontinue further fish oil use,  and utilize the cyclic cough protocol with Tussionex and Tessalon  Perles.  We will see the patient back in followup in 1 month.     Charlcie Cradle Delford Field, MD, [facility]  Electronically Signed    PEW/MedQ  DD: 12/29/2005  DT: 12/29/2005  Job #: [id number]   cc:   Donia Guiles, M.D.

## 2010-05-20 NOTE — Op Note (Signed)
NAME:  Marie [name], Marie [name]            ACCOUNT NO.:  [id number]   MEDICAL RECORD NO.:  [id number]          PATIENT TYPE:  AMB   LOCATION:  DSC                          FACILITY:  [facility]   PHYSICIAN:  Katy Fitch. Sypher, M.D. 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   OPERATION:  Release of left transverse carpal ligament.   OPERATIONS:  Katy Fitch. Sypher, M.D.   ASSISTANT:  Marveen Reeks. Dasnoit, P.A.-C.   ANESTHESIA:  General by LMA.   SUPERVISING ANESTHESIOLOGIST:  Zenon Mayo, M.D.   INDICATIONS:  Marie [name] is a 55 year old woman referred for  evaluation and management of bilateral hand numbness.  She is status post  release of her right transverse carpal ligament.  Electrodiagnostic studies  also documented significant left carpal tunnel syndrome.  Due to a failure  to respond to nonoperative measures, she is brought to the operating room at  this time for release of her left transverse carpal ligament.   PROCEDURE:  Marie [name] is brought to the operating room and placed in  the supine position on the operating table.  Following induction of general  anesthesia by LMA technique, the left arm was prepped with Betadine soap  solution and sterilely draped.  Following exsanguination of the left arm  with an Esmarch bandage, the arterial tourniquet was inflated to 220 mmHg.  The procedure commenced with a short incision in the line of the ring finger  in the palm.  The subcutaneous tissues were carefully divided revealing the  palmar fascia.  This was split longitudinally to reveal the common sensory  branch of the median nerve.  These were followed back to the transverse  carpal ligament which was gently  isolated from the median nerve with a  Insurance risk surveyor.  The ligament was released along its ulnar border with  scissors extending into the distal forearm.  This widely opened the carpal  canal.  No mass or other predicaments were noted.  Bleeding points along the  margin of the released ligament were electrocauterized with bipolar current  followed by repair of the skin with intradermal 3-0 Prolene suture.  A  compressive dressing was applied with a volar plaster splint maintaining the  wrist in 5 degrees of dorsiflexion.   For aftercare, she is provided a prescription for Percocet 5 mg one p.o. q.4-  6h. p.r.n. pain, 20 tablets without refill.     [name], M.D.  Electronically Signed    RVS/MEDQ  D:  06/09/2005  T:  06/09/2005  Job:  [id number]

## 2010-07-13 ENCOUNTER — Ambulatory Visit: Payer: Medicaid Other | Attending: Sports Medicine | Admitting: Physical Therapy

## 2010-07-13 DIAGNOSIS — M6281 Muscle weakness (generalized): Secondary | ICD-10-CM | POA: Insufficient documentation

## 2010-07-13 DIAGNOSIS — R262 Difficulty in walking, not elsewhere classified: Secondary | ICD-10-CM | POA: Insufficient documentation

## 2010-07-13 DIAGNOSIS — IMO0001 Reserved for inherently not codable concepts without codable children: Secondary | ICD-10-CM | POA: Insufficient documentation

## 2010-07-13 DIAGNOSIS — M25569 Pain in unspecified knee: Secondary | ICD-10-CM | POA: Insufficient documentation

## 2010-07-21 ENCOUNTER — Emergency Department (HOSPITAL_COMMUNITY)
Admission: EM | Admit: 2010-07-21 | Discharge: 2010-07-21 | Disposition: A | Discharge status: Home / Self Care | Payer: Medicaid Other | Attending: Emergency Medicine | Admitting: Emergency Medicine

## 2010-07-21 ENCOUNTER — Ambulatory Visit: Payer: Medicaid Other | Admitting: Physical Therapy

## 2010-07-21 ENCOUNTER — Emergency Department (HOSPITAL_COMMUNITY): Payer: Medicaid Other

## 2010-07-21 DIAGNOSIS — M25579 Pain in unspecified ankle and joints of unspecified foot: Secondary | ICD-10-CM | POA: Insufficient documentation

## 2010-07-21 DIAGNOSIS — E7800 Pure hypercholesterolemia, unspecified: Secondary | ICD-10-CM | POA: Insufficient documentation

## 2010-07-21 DIAGNOSIS — Z79899 Other long term (current) drug therapy: Secondary | ICD-10-CM | POA: Insufficient documentation

## 2010-07-21 DIAGNOSIS — S93609A Unspecified sprain of unspecified foot, initial encounter: Secondary | ICD-10-CM | POA: Insufficient documentation

## 2010-07-21 DIAGNOSIS — M79609 Pain in unspecified limb: Secondary | ICD-10-CM | POA: Insufficient documentation

## 2010-07-21 DIAGNOSIS — M25473 Effusion, unspecified ankle: Secondary | ICD-10-CM | POA: Insufficient documentation

## 2010-07-21 DIAGNOSIS — Y92009 Unspecified place in unspecified non-institutional (private) residence as the place of occurrence of the external cause: Secondary | ICD-10-CM | POA: Insufficient documentation

## 2010-07-21 DIAGNOSIS — S8990XA Unspecified injury of unspecified lower leg, initial encounter: Secondary | ICD-10-CM | POA: Insufficient documentation

## 2010-07-21 DIAGNOSIS — X500XXA Overexertion from strenuous movement or load, initial encounter: Secondary | ICD-10-CM | POA: Insufficient documentation

## 2010-07-21 DIAGNOSIS — I10 Essential (primary) hypertension: Secondary | ICD-10-CM | POA: Insufficient documentation

## 2010-07-21 DIAGNOSIS — M25476 Effusion, unspecified foot: Secondary | ICD-10-CM | POA: Insufficient documentation

## 2010-07-21 DIAGNOSIS — S99929A Unspecified injury of unspecified foot, initial encounter: Secondary | ICD-10-CM | POA: Insufficient documentation

## 2010-07-21 DIAGNOSIS — S93409A Sprain of unspecified ligament of unspecified ankle, initial encounter: Secondary | ICD-10-CM | POA: Insufficient documentation

## 2010-07-22 ENCOUNTER — Ambulatory Visit: Payer: Medicaid Other | Admitting: Physical Therapy

## 2010-07-26 ENCOUNTER — Ambulatory Visit: Payer: Medicaid Other | Admitting: Physical Therapy

## 2010-07-28 ENCOUNTER — Ambulatory Visit: Payer: Medicaid Other | Admitting: Physical Therapy

## 2010-08-05 ENCOUNTER — Ambulatory Visit: Payer: Medicaid Other | Attending: Sports Medicine | Admitting: Physical Therapy

## 2010-08-05 DIAGNOSIS — R262 Difficulty in walking, not elsewhere classified: Secondary | ICD-10-CM | POA: Insufficient documentation

## 2010-08-05 DIAGNOSIS — M6281 Muscle weakness (generalized): Secondary | ICD-10-CM | POA: Insufficient documentation

## 2010-08-05 DIAGNOSIS — IMO0001 Reserved for inherently not codable concepts without codable children: Secondary | ICD-10-CM | POA: Insufficient documentation

## 2010-08-05 DIAGNOSIS — M25569 Pain in unspecified knee: Secondary | ICD-10-CM | POA: Insufficient documentation

## 2010-08-09 ENCOUNTER — Encounter: Payer: Medicaid Other | Admitting: Physical Therapy

## 2010-10-11 LAB — POCT HEMOGLOBIN-HEMACUE: Hemoglobin: 16 — ABNORMAL HIGH

## 2010-10-18 ENCOUNTER — Emergency Department (HOSPITAL_COMMUNITY): Payer: Self-pay

## 2010-10-18 ENCOUNTER — Emergency Department (HOSPITAL_COMMUNITY)
Admission: EM | Admit: 2010-10-18 | Discharge: 2010-10-19 | Disposition: A | Discharge status: Home / Self Care | Payer: Self-pay | Attending: Emergency Medicine | Admitting: Emergency Medicine

## 2010-10-18 DIAGNOSIS — W1789XA Other fall from one level to another, initial encounter: Secondary | ICD-10-CM | POA: Insufficient documentation

## 2010-10-18 DIAGNOSIS — Y93K1 Activity, walking an animal: Secondary | ICD-10-CM | POA: Insufficient documentation

## 2010-10-18 DIAGNOSIS — Z79899 Other long term (current) drug therapy: Secondary | ICD-10-CM | POA: Insufficient documentation

## 2010-10-18 DIAGNOSIS — S93409A Sprain of unspecified ligament of unspecified ankle, initial encounter: Secondary | ICD-10-CM | POA: Insufficient documentation

## 2010-10-18 DIAGNOSIS — Y998 Other external cause status: Secondary | ICD-10-CM | POA: Insufficient documentation

## 2010-10-18 DIAGNOSIS — M549 Dorsalgia, unspecified: Secondary | ICD-10-CM | POA: Insufficient documentation

## 2010-10-18 DIAGNOSIS — S82109A Unspecified fracture of upper end of unspecified tibia, initial encounter for closed fracture: Secondary | ICD-10-CM | POA: Insufficient documentation

## 2010-10-18 DIAGNOSIS — G43909 Migraine, unspecified, not intractable, without status migrainosus: Secondary | ICD-10-CM | POA: Insufficient documentation

## 2010-10-18 DIAGNOSIS — E7800 Pure hypercholesterolemia, unspecified: Secondary | ICD-10-CM | POA: Insufficient documentation

## 2010-10-18 DIAGNOSIS — Y92009 Unspecified place in unspecified non-institutional (private) residence as the place of occurrence of the external cause: Secondary | ICD-10-CM | POA: Insufficient documentation

## 2010-10-18 DIAGNOSIS — I10 Essential (primary) hypertension: Secondary | ICD-10-CM | POA: Insufficient documentation

## 2011-02-13 ENCOUNTER — Ambulatory Visit: Payer: Medicaid Other | Attending: Orthopedic Surgery | Admitting: Physical Therapy

## 2011-02-13 DIAGNOSIS — M25569 Pain in unspecified knee: Secondary | ICD-10-CM | POA: Insufficient documentation

## 2011-02-13 DIAGNOSIS — IMO0001 Reserved for inherently not codable concepts without codable children: Secondary | ICD-10-CM | POA: Insufficient documentation

## 2011-02-13 DIAGNOSIS — R262 Difficulty in walking, not elsewhere classified: Secondary | ICD-10-CM | POA: Insufficient documentation

## 2011-02-13 DIAGNOSIS — M25669 Stiffness of unspecified knee, not elsewhere classified: Secondary | ICD-10-CM | POA: Insufficient documentation

## 2011-02-20 ENCOUNTER — Ambulatory Visit: Payer: Medicaid Other | Admitting: Physical Therapy

## 2011-02-27 ENCOUNTER — Ambulatory Visit: Payer: Medicaid Other | Admitting: Physical Therapy

## 2011-03-06 ENCOUNTER — Ambulatory Visit: Payer: Medicaid Other | Attending: Orthopedic Surgery | Admitting: Physical Therapy

## 2011-03-06 DIAGNOSIS — R262 Difficulty in walking, not elsewhere classified: Secondary | ICD-10-CM | POA: Insufficient documentation

## 2011-03-06 DIAGNOSIS — IMO0001 Reserved for inherently not codable concepts without codable children: Secondary | ICD-10-CM | POA: Insufficient documentation

## 2011-03-06 DIAGNOSIS — M25669 Stiffness of unspecified knee, not elsewhere classified: Secondary | ICD-10-CM | POA: Insufficient documentation

## 2011-03-06 DIAGNOSIS — M25569 Pain in unspecified knee: Secondary | ICD-10-CM | POA: Insufficient documentation

## 2011-06-26 ENCOUNTER — Emergency Department (HOSPITAL_COMMUNITY)
Admission: EM | Admit: 2011-06-26 | Discharge: 2011-06-27 | Disposition: A | Discharge status: Home / Self Care | Payer: Self-pay | Attending: Emergency Medicine | Admitting: Emergency Medicine

## 2011-06-26 ENCOUNTER — Encounter (HOSPITAL_COMMUNITY): Payer: Self-pay | Admitting: *Deleted

## 2011-06-26 DIAGNOSIS — F17200 Nicotine dependence, unspecified, uncomplicated: Secondary | ICD-10-CM | POA: Insufficient documentation

## 2011-06-26 DIAGNOSIS — Z881 Allergy status to other antibiotic agents status: Secondary | ICD-10-CM | POA: Insufficient documentation

## 2011-06-26 DIAGNOSIS — J329 Chronic sinusitis, unspecified: Secondary | ICD-10-CM | POA: Insufficient documentation

## 2011-06-26 DIAGNOSIS — J029 Acute pharyngitis, unspecified: Secondary | ICD-10-CM | POA: Insufficient documentation

## 2011-06-26 MED ORDER — CETIRIZINE-PSEUDOEPHEDRINE ER 5-120 MG PO TB12: 1.0000 | ORAL_TABLET | Freq: Every day | ORAL | Status: AC

## 2011-06-26 MED ORDER — AZITHROMYCIN 250 MG PO TABS: ORAL_TABLET | ORAL | Status: AC

## 2011-06-26 MED ORDER — LIDOCAINE VISCOUS 2 % MT SOLN
20.0000 mL | Freq: Once | OROMUCOSAL | Status: AC
  Administered 2011-06-26: 15 mL via OROMUCOSAL

## 2011-06-26 MED ORDER — GUAIFENESIN ER 600 MG PO TB12
600.0000 mg | ORAL_TABLET | Freq: Two times a day (BID) | ORAL | Status: AC
Start: 2011-06-26 — End: 2012-06-25

## 2011-06-26 NOTE — Discharge Instructions (Signed)
You were seen and evaluated for your symptoms of sore throat, nasal congestion and sinus pressure. You were given prescriptions for medications an antibiotic to help treat your symptoms. Please take these for the full length of time. Continue drink plenty of fluids to stay hydrated. Please followup with your primary care provider.   Sinusitis Sinuses are air pockets within the bones of your face. The growth of bacteria within a sinus leads to infection. The infection prevents the sinuses from draining. This infection is called sinusitis. SYMPTOMS  There will be different areas of pain depending on which sinuses have become infected.  The maxillary sinuses often produce pain beneath the eyes.   Frontal sinusitis may cause pain in the middle of the forehead and above the eyes.  Other problems (symptoms) include:  Toothaches.   Colored, pus-like (purulent) drainage from the nose.   Swelling, warmth, and tenderness over the sinus areas may be signs of infection.  TREATMENT  Sinusitis is most often determined by an exam.X-rays may be taken. If x-rays have been taken, make sure you obtain your results or find out how you are to obtain them. Your caregiver may give you medications (antibiotics). These are medications that will help kill the bacteria causing the infection. You may also be given a medication (decongestant) that helps to reduce sinus swelling.  HOME CARE INSTRUCTIONS   Only take over-the-counter or prescription medicines for pain, discomfort, or fever as directed by your caregiver.   Drink extra fluids. Fluids help thin the mucus so your sinuses can drain more easily.   Applying either moist heat or ice packs to the sinus areas may help relieve discomfort.   Use saline nasal sprays to help moisten your sinuses. The sprays can be found at your local drugstore.  SEEK IMMEDIATE MEDICAL CARE IF:  You have a fever.   You have increasing pain, severe headaches, or toothache.   You  have nausea, vomiting, or drowsiness.   You develop unusual swelling around the face or trouble seeing.  MAKE SURE YOU:   Understand these instructions.   Will watch your condition.   Will get help right away if you are not doing well or get worse.  Document Released: 12/19/2004 Document Revised: 12/08/2010 Document Reviewed: 07/18/2006 [facility] Patient Information 2012 [place], [place].   Salt Water Gargle This solution will help make your mouth and throat feel better. HOME CARE INSTRUCTIONS   Mix 1 teaspoon of salt in 8 ounces of warm water.   Gargle with this solution as much or often as you need or as directed. Swish and gargle gently if you have any sores or wounds in your mouth.   Do not swallow this mixture.  Document Released: 09/23/2003 Document Revised: 12/08/2010 Document Reviewed: 02/14/2008 [facility] Patient Information 2012 [place], [place].     RESOURCE GUIDE  Chronic Pain Problems: Contact [facility]  [phone] Patients need to be referred by their primary care doctor.  Insufficient Money for Medicine: Contact United Way:  call "211" or [facility] 831-218-9851.  No Primary Care Doctor: - Call [facility]  [phone] - can help you locate a primary care doctor that  accepts your insurance, provides certain services, etc. - Physician Referral Service- [phone]  Agencies that provide inexpensive medical care: - [facility]  [phone] - [facility]  [phone] - [facility]  [phone] - Women's Clinic  [phone] - Planned Parenthood  [phone] - [facility]  [phone]  Medicaid-accepting [facility] Providers: - [facility]- [address]  [phone], Mon-Fri 9am-7pm, Sat 9am-1pm - [facility]- [address] [place], [place]  [phone] - [facility]- [address]  [place]  [phone] [facility]- [address]  [phone] - Renaye Rakers- [address] [place], [address], [phone]  Only accepts [place] patients after they have their name  applied to their card  Self Pay (no insurance) in [place]: - Sickle Cell Patients: Dr Willey Blade, [facility]  [address] [place], [phone] - [facility]- [address] [place]  [phone]       [facility] [place]- [address]       -     [facility]- see information above (Speak to [employer] if you do not have insurance)       -  [facility]- [address] [place], [phone]       -  [facility]- [address],  [phone]       -  [facility]- [address], [phone]       -  Dr Julio Sicks-  [address], [place], [phone]       -  [facility]- [address] [phone]       -  [facility]- [address] [phone], also [address] [phone]       -    [facility]- [address] [place], [phone], 1st & 3rd Saturday   every month, 10am-1pm  1) Find a Doctor and Pay Out of Pocket Although you won't have to find out who is covered by your insurance plan, it is a good idea to ask around and get recommendations. You will then need to call the office and see if the doctor you have chosen will accept you as a new patient and what types of options they offer for patients who are self-pay. Some doctors offer discounts or will set up payment plans for their patients who do not have insurance, but you will need to ask so you aren't surprised when you get to your appointment.  2) Contact Your Local Health Department Not all health departments have doctors that can see patients for sick visits, but many do, so it is worth a call to see if yours does. If you don't know where your local health department is, you can check in  your phone book. The CDC also has a tool to help you locate your state's health department, and many state websites also have listings of all of their local health departments.  3) Find a Walk-in Clinic If your illness is not likely to be very severe or complicated, you may want to try a walk in clinic. These are popping up all over the country in pharmacies, drugstores, and shopping centers. They're usually staffed by nurse practitioners or physician assistants that have been trained to treat common illnesses and complaints. They're usually fairly quick and inexpensive. However, if you have serious medical issues or chronic medical problems, these are probably not your best option  STD Testing - [facility] Department of [facility] [place], STD Clinic, [address] [place], phone [phone] or [phone].  Monday - Friday,  call for an appointment. [facility] Department of [employer], STD Clinic, [address], [place], phone [phone] or [phone].  Monday - Friday, call for an appointment.  Abuse/Neglect: [facility] Child Abuse Hotline [phone] [facility] Child Abuse Hotline 713-578-4849 (After Hours)  Emergency Shelter:  [facility] 418-505-2464  Maternity Homes: - Room at the [facility] (206)[phone] - [facility] 301-086-5259  MRSA Hotline #:   270-101-6235  [facility] Resources  [facility]  [employer] [facility] Dept. [address]                 [address]         [address] [place] [address]  Blondell Reveal Phone:  [phone]                                  Phone:  [phone]                   Phone:  [phone]  [facility], [phone] - [facility] - [phone]       -     [facility] in [place], [address]                                  507-117-1735, [facility] Child Abuse Hotline 319-657-9097 or (216)790-7087 (After Hours)   Behavioral Health Services  Substance Abuse Resources: - [facility] Drug [facility]  314-451-1471 - [facility] 313-372-8185 - [facility] [place] [phone] Floydene Flock (905)[phone] - Residential & Outpatient Substance Abuse Program  [phone]  Psychological Services: [facility]  [phone] Services  [phone] - [facility], [phone] [place]. [address] [place], ACCESS LINE: 617-421-4405 or [phone], [employer]za  Dental Assistance  If unable to pay or uninsured, contact:  [facility] or [facility]. to become qualified for the adult dental clinic.  Patients with Medicaid: [facility] [phone] [address], [phone] [address] [phone]  If unable to pay, or uninsured, contact [facility] [phone]) or [facility] Department [phone] in [place], [phone] in [facility]) to become qualified for the adult dental clinic  Other Low-Cost Community Dental Services: - [facility]- [address] [place], [place], [place], [address], [phone], [phone], 2nd and 4th Thursday of the month at 6:30am.  10 clients each day by appointment, can sometimes see walk-in patients if someone does not show for an appointment. [facility]- [address] [place], [place], [address], [phone] - [facility] [address], [place], [place], [address], [phone] [facility] Health Department- [phone] [facility] Health Department- [phone] [facility] Department-  [phone]     

## 2011-06-26 NOTE — ED Provider Notes (Signed)
History     CSN: [id number]  Arrival date & time 06/26/11  4098   First MD Initiated Contact with Patient 06/26/11 2157      Chief Complaint  Patient presents with  . Sore Throat    HPI  History provided by the patient. Patient is a 56 year old female with no significant past medical history who is a current smoker and presents with complaints of nasal congestion, sinus pressure and sore throat for the past 2 weeks. Symptoms began with nasal congestion that progressed sore throat symptoms, general fatigue with symptomatic fevers and chills at home. Patient reports occasional slight coughing at times is productive of phlegm. Patient denies any known sick contacts. Patient has been using some over-the-counter cough and cold medicines without relief. Patient denies any chest pain or shortness of breath. Symptoms are described as moderate. Patient has not had any recent travel or any known sick contacts.      History reviewed. No pertinent past medical history.  History reviewed. No pertinent past surgical history.  No family history on file.  History  Substance Use Topics  . Smoking status: Current Everyday Smoker  . Smokeless tobacco: Not on file  . Alcohol Use: No    OB History    Grav Para Term Preterm Abortions TAB SAB Ect Mult Living                  Review of Systems  Constitutional: Positive for fever and fatigue. Negative for chills.  HENT: Positive for congestion, sore throat, rhinorrhea and sinus pressure.   Respiratory: Positive for cough.   Gastrointestinal: Negative for nausea, vomiting and diarrhea.  Skin: Negative for rash.    Allergies  Statins and Sulfa antibiotics  Home Medications   Current Outpatient Rx  Name Route Sig Dispense Refill  . ATENOLOL 50 MG PO TABS Oral Take 50 mg by mouth daily. Take twice daily per patient.    . TRILIPIX PO Oral Take 1 tablet by mouth daily.    Marland Kitchen CITALOPRAM HYDROBROMIDE 40 MG PO TABS Oral Take 40 mg by mouth  daily.    Marland Kitchen OMEPRAZOLE 20 MG PO CPDR Oral Take 20 mg by mouth daily.    . TOPIRAMATE 100 MG PO TABS Oral Take 100 mg by mouth 2 (two) times daily.    . VENLAFAXINE HCL 50 MG PO TABS Oral Take 50 mg by mouth daily.      BP 122/79  Pulse 65  Temp 98.6 F (37 C) (Oral)  Resp 19  SpO2 96%  Physical Exam  Nursing note and vitals reviewed. Constitutional: She is oriented to person, place, and time. She appears well-developed and well-nourished. No distress.  HENT:  Head: Normocephalic.  Mouth/Throat: Oropharynx is clear and moist.       Erythema and cobblestoning of pharynx.  Poor air movement through right nostril. Nasal drainage present bilaterally. Mild tenderness over bilateral maxillary sinuses.  Neck: Normal range of motion. Neck supple.       No meningeal signs  Cardiovascular: Normal rate and regular rhythm.   Pulmonary/Chest: Effort normal and breath sounds normal. No respiratory distress. She has no wheezes. She has no rales.       Coughing present  Neurological: She is alert and oriented to person, place, and time.  Skin: Skin is warm and dry.  Psychiatric: She has a normal mood and affect. Her behavior is normal.    ED Course  Procedures    Labs Reviewed  RAPID STREP SCREEN  LAB REPORT - SCANNED     1. Sinusitis   2. Pharyngitis       MDM  Patient seen and evaluated. Patient no acute distress.        Angus Seller, [place] 06/28/11 [phone]

## 2011-06-26 NOTE — ED Notes (Signed)
sorethroat nv for 2 weeks.  Sinus drainage

## 2011-06-29 NOTE — ED Provider Notes (Signed)
Medical screening examination/treatment/procedure(s) were performed by non-physician practitioner and as supervising physician I was immediately available for consultation/collaboration.   Nat Christen, MD 06/29/11 [phone]

## 2012-09-06 ENCOUNTER — Ambulatory Visit (INDEPENDENT_AMBULATORY_CARE_PROVIDER_SITE_OTHER): Payer: PRIVATE HEALTH INSURANCE | Admitting: Family Medicine

## 2012-09-06 ENCOUNTER — Encounter: Payer: Self-pay | Admitting: Family Medicine

## 2012-09-06 VITALS — BP 133/84 | HR 68 | Ht 63.5 in | Wt 222.0 lb

## 2012-09-06 DIAGNOSIS — G43909 Migraine, unspecified, not intractable, without status migrainosus: Secondary | ICD-10-CM

## 2012-09-06 DIAGNOSIS — L918 Other hypertrophic disorders of the skin: Secondary | ICD-10-CM

## 2012-09-06 DIAGNOSIS — I10 Essential (primary) hypertension: Secondary | ICD-10-CM

## 2012-09-06 DIAGNOSIS — L909 Atrophic disorder of skin, unspecified: Secondary | ICD-10-CM

## 2012-09-06 NOTE — Patient Instructions (Addendum)
Please see an ophthalmologist for your eyes.   For the blood pressure, at the front desk, ask to sign up for the 24hr blood pressure monitor.   For the skin spots, make an appointment with me for skin procedure: DO NOT DOUBLE BOOK.

## 2012-09-09 ENCOUNTER — Ambulatory Visit (INDEPENDENT_AMBULATORY_CARE_PROVIDER_SITE_OTHER): Payer: PRIVATE HEALTH INSURANCE | Admitting: Pharmacist

## 2012-09-09 ENCOUNTER — Encounter: Payer: Self-pay | Admitting: Family Medicine

## 2012-09-09 ENCOUNTER — Encounter: Payer: Self-pay | Admitting: Pharmacist

## 2012-09-09 VITALS — BP 143/82 | HR 79 | Ht 64.0 in | Wt 221.0 lb

## 2012-09-09 DIAGNOSIS — L918 Other hypertrophic disorders of the skin: Secondary | ICD-10-CM | POA: Insufficient documentation

## 2012-09-09 DIAGNOSIS — I10 Essential (primary) hypertension: Secondary | ICD-10-CM | POA: Insufficient documentation

## 2012-09-09 DIAGNOSIS — G43909 Migraine, unspecified, not intractable, without status migrainosus: Secondary | ICD-10-CM | POA: Insufficient documentation

## 2012-09-09 NOTE — Assessment & Plan Note (Signed)
Offered to remove them at follow up appointment. Will also likely biopsy moles on thigh and back

## 2012-09-09 NOTE — Progress Notes (Signed)
Patient ID: Marie [name]    DOB: 02-Jul-1955, 57 y.o.   MRN: [id number] --- Subjective:  Marie [name] is a 57 y.o.female who presents to establish care as new patient. Her concerns are the following:  - hypertension: feels like her BP is high. She takes atenolol 50mg  daily and feels like it doesn't work. In the last month, she has taken her BP and it has been as high as 180-190 systolic. She denies any chest pain, any shortness of breath, any lower extremity swelling.   - headache: She reports a daily headache associated with a weekly migraine, which has not changed in many years. Pain is located on the left side of her forehead around  Her eye. Pulsating pain. Associated with phono and photophobia. She started having migraine headaches 8 years ago. She takes advil 3 times a day which helps. She was started on topiramate for migraine prophylaxis and has not noticed a big difference.   - skin tags and moles: has skin tags on neck that get irritated and inflamed. She has also noticed a mole on her left thigh that has changed in size and shape.    ROS: see HPI Past Medical History: reviewed and updated medications and allergies. Social History: Tobacco: current smoker  Objective: Filed Vitals:   09/06/12 1043  BP: 133/84  Pulse: 68    Physical Examination:   General appearance - alert, well appearing, and in no distress Nose - normal and patent, no erythema, discharge or polyps Mouth - mucous membranes moist, pharynx normal without lesions Neck - supple, no significant adenopathy Chest - clear to auscultation, no wheezes, rales or rhonchi, symmetric air entry Heart - normal rate, regular rhythm, normal S1, S2, no murmurs Abdomen - soft, nontender, nondistended, no masses or organomegaly Extremities - no edema Neuro - CN2-12 grossly intact, normal strength in upper extremities.  Skin - 3-4 skin tags on left side of neck, irregularly shaped mole on left thigh, sebarrheic keratosis on back,  irregular mole on back, less than 0.5cm in size.

## 2012-09-09 NOTE — Assessment & Plan Note (Signed)
With reported elevated BP's, will have patient sign up for the 24hr BP monitoring with pharmacy clinic.  continue atenolol for now

## 2012-09-09 NOTE — Assessment & Plan Note (Addendum)
Migraine headaches. Unclear if reported elevated BP's in 190's could also be contributing.  Continue with topiramate for now.  Will re-evaluate at next visit.  No focal findings suggesting need for head imaging Encouraged patient to see ophthalmologist as well.

## 2012-09-09 NOTE — Progress Notes (Signed)
S:    Patient arrives walking without assitance with fair mood, slight reluctance to visit. She presents to the clinic for ambulatory blood pressure evaluation.  Hx of taking atenolol >5 yrs for migraine prophylaxis, with wrist monitor BP check systolic 180-190s.  Medication compliance is reported to be good as reported.  Discussed procedure for wearing the monitor and gave patient written instructions. Monitor was placed on non-dominant (left) arm with instructions to return in the morning.   Current BP Medications include:  Atenolol 50mg  once daily Antihypertensives tried in the past include: none  Patient returned to the clinic and reported "I was so ready to give this back to you." She complains of soreness in her left bicep from BP cuff.  O:  Last 2 Office BP readings: 143/82 mmHg 138/80 mmHg  Today's Office BP reading: 143/82 mmHg (automated reading)  ABPM Study Data: Arm Placement left arm   Overall Mean 24hr BP:   159/82 mmHg HR: 77   Daytime Mean BP:  150/77 mmHg HR: 78   Nighttime Mean BP:  187/90 mmHg HR: 74   Dipping Pattern: no  Sys:   -24.2%   Dia: -13.8%   [normal dipping ~10-20%]   Non-hypertensive ABPM thresholds: daytime BP <135/85 mmHg, sleeptime BP <120/70 mmHg NICE Hypertension Guidelines ([place]) using ABPM: Stage I: >135/85 mmHg, Stage 2: >150/95 mmHg)   BMET    Component Value Date/Time   NA 139 02/01/2010 1105   K 3.8 02/01/2010 1105   CL 101 02/01/2010 1105   CO2 29 02/01/2010 1105   GLUCOSE 108* 02/01/2010 1105   BUN 6 02/01/2010 1105   CREATININE 0.61 02/01/2010 1105   CALCIUM 9.7 02/01/2010 1105   GFRNONAA >60 02/01/2010 1105   GFRAA  Value: >60        The eGFR has been calculated using the MDRD equation. This calculation has not been validated in all clinical situations. eGFR's persistently <60 mL/min signify possible Chronic Kidney Disease. 02/01/2010 1105    A/P: Ms. Barnhardt has a history of migraines taking atenolol 50mg  for prophylaxis >5years,  with self-reported elevated home wrist monitor BP readings of 180s/100s despite relatively normal reading in clinic today. 24-hour ambulatory blood pressure demonstrates a biphasic model of hypertension, with normal, consistent BP in the morning but significantly elevated readings during the night. Patient denies any complications during the night, though a  "kink in the cord" is not impossible. Readings show an average blood pressure of 159/82 mmHg, and a nocturnal dipping pattern that is abnormal  with systolic BP as high as 220 mmHg.  Changes to medications today: Increase dose to atenolol 50mg  twice daily to provide evening coverage. Results reviewed and written information provided.   Patient educated to monitor BP wrist readings in the evening for efficacy and to contact the office with any problems. F/U Clinic Visit with Dr. Gwenlyn Saran in 2-3 weeks. Total time in face-to-face counseling 35 minutes.  Patient seen with Anthony Sar, PharmD Resident.

## 2012-09-09 NOTE — Patient Instructions (Addendum)
   Increase your Atenolol to twice daily  Continue to use your wrist BP monitor - record results and bring monitor and record to next visit.   Please follow up with Dr. Gwenlyn Saran in 2-3 weeks.

## 2012-09-10 MED ORDER — ATENOLOL 50 MG PO TABS: 50.0000 mg | ORAL_TABLET | Freq: Two times a day (BID) | ORAL | Status: DC

## 2012-09-10 NOTE — Assessment & Plan Note (Signed)
Ms. Braver has a history of migraines taking atenolol 50mg  for prophylaxis >5years, with self-reported elevated home wrist monitor BP readings of 180s/100s despite relatively normal reading in clinic today. 24-hour ambulatory blood pressure demonstrates a biphasic model of hypertension, with normal, consistent BP in the morning but significantly elevated readings during the night. Patient denies any complications during the night, though a  "kink in the cord" is not impossible. Readings show an average blood pressure of 159/82 mmHg, and a nocturnal dipping pattern that is abnormal  with systolic BP as high as 220 mmHg.  Changes to medications today: Increase dose to atenolol 50mg  twice daily to provide evening coverage. Results reviewed and written information provided.   Patient educated to monitor BP wrist readings in the evening for efficacy and to contact the office with any problems. F/U Clinic Visit with Dr. Gwenlyn Saran in 2-3 weeks. Total time in face-to-face counseling 35 minutes.  Patient seen with Anthony Sar, PharmD Resident.

## 2012-09-11 NOTE — Progress Notes (Signed)
Patient ID: Marie [name], female   DOB: August 10, 1955, 57 y.o.   MRN: [id number] Reviewed: Agree with Dr. Macky Lower documentation and management.

## 2012-10-14 ENCOUNTER — Encounter: Payer: Self-pay | Admitting: Family Medicine

## 2012-10-14 ENCOUNTER — Ambulatory Visit (INDEPENDENT_AMBULATORY_CARE_PROVIDER_SITE_OTHER): Payer: PRIVATE HEALTH INSURANCE | Admitting: Family Medicine

## 2012-10-14 VITALS — BP 120/79 | HR 71 | Temp 98.6°F | Ht 64.0 in | Wt 219.0 lb

## 2012-10-14 DIAGNOSIS — L918 Other hypertrophic disorders of the skin: Secondary | ICD-10-CM

## 2012-10-14 DIAGNOSIS — F411 Generalized anxiety disorder: Secondary | ICD-10-CM

## 2012-10-14 DIAGNOSIS — L909 Atrophic disorder of skin, unspecified: Secondary | ICD-10-CM

## 2012-10-14 DIAGNOSIS — D485 Neoplasm of uncertain behavior of skin: Secondary | ICD-10-CM

## 2012-10-14 MED ORDER — DIAZEPAM 5 MG PO TABS: 5.0000 mg | ORAL_TABLET | Freq: Three times a day (TID) | ORAL | Status: DC | PRN

## 2012-10-14 MED ORDER — VENLAFAXINE HCL ER 75 MG PO CP24: 225.0000 mg | ORAL_CAPSULE | Freq: Every day | ORAL | Status: DC

## 2012-10-14 NOTE — Patient Instructions (Signed)
Please follow up at procedure clinic to biopsy the moles on your left leg and your back.    For the skin tags, if it gets red or warm or you have fevers, come back to the clinic. Otherwise, you can clean it with soap and water. You can put some bacitracin or simple vaseline on the area as well.

## 2012-10-15 DIAGNOSIS — F329 Major depressive disorder, single episode, unspecified: Secondary | ICD-10-CM | POA: Insufficient documentation

## 2012-10-15 DIAGNOSIS — D485 Neoplasm of uncertain behavior of skin: Secondary | ICD-10-CM | POA: Insufficient documentation
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
## 2012-10-15 NOTE — Assessment & Plan Note (Signed)
Time allotted was not enough for removal of skin tags and biopsy moles on back and thigh.  - urged patient to make appointment at procedure clinic to get biopsy of moles since the one on her leg has changed in size and color.

## 2012-10-15 NOTE — Assessment & Plan Note (Signed)
Removed about 10 tags. Patient tolerated procedure well
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
## 2012-10-17 ENCOUNTER — Ambulatory Visit (INDEPENDENT_AMBULATORY_CARE_PROVIDER_SITE_OTHER): Payer: PRIVATE HEALTH INSURANCE | Admitting: Family Medicine

## 2012-10-17 VITALS — BP 125/58 | HR 68 | Temp 99.0°F | Ht 64.0 in | Wt 223.0 lb

## 2012-10-17 DIAGNOSIS — D235 Other benign neoplasm of skin of trunk: Secondary | ICD-10-CM

## 2012-10-17 DIAGNOSIS — D2272 Melanocytic nevi of left lower limb, including hip: Secondary | ICD-10-CM

## 2012-10-17 DIAGNOSIS — D2370 Other benign neoplasm of skin of unspecified lower limb, including hip: Secondary | ICD-10-CM

## 2012-10-17 DIAGNOSIS — D485 Neoplasm of uncertain behavior of skin: Secondary | ICD-10-CM

## 2012-10-17 DIAGNOSIS — D225 Melanocytic nevi of trunk: Secondary | ICD-10-CM

## 2012-10-18 ENCOUNTER — Ambulatory Visit (INDEPENDENT_AMBULATORY_CARE_PROVIDER_SITE_OTHER): Payer: PRIVATE HEALTH INSURANCE | Admitting: *Deleted

## 2012-10-18 ENCOUNTER — Encounter: Payer: Self-pay | Admitting: Family Medicine

## 2012-10-18 DIAGNOSIS — Z23 Encounter for immunization: Secondary | ICD-10-CM

## 2012-10-22 NOTE — Progress Notes (Signed)
Patient ID: Marie [name], female   DOB: 08-28-1955, 57 y.o.   MRN: [id number] Pt here for punch biopsy of irregular nevus on upper back and on thigh Patient given informed consent for procedure, signed copy in teh chart. Appropriate time out taken. Assisted by Drs. Adriana Simas and Paauilo. 1/2 cc 2% lidocaine with w epi used for local anasthesia. 3 mm punch used for each biospy. Hemostasis obtained with pressure. Pathology pending. Bandaid applied. Pos tprocedure instructions given--no restrictions. Patient tolerated procedure well with scant bleeding and no complications.

## 2012-12-11 ENCOUNTER — Telehealth: Payer: Self-pay | Admitting: Family Medicine

## 2012-12-11 NOTE — Telephone Encounter (Signed)
Patient dropped off handicapped placard form to be filled out.  Please call her when completed. °

## 2012-12-11 NOTE — Telephone Encounter (Signed)
Form put in PCP's box for review. .Marie [name]  

## 2012-12-13 NOTE — Telephone Encounter (Signed)
Please let patient know that form is ready for her to pick up at front desk.  Please let patient know that I am filling out a temporary placard for 6 months. Patient will need to be evaluated in the office for a permanent placard. She will also need to bring records from her orthopedic doctor documenting the type of procedures she has had on her knee.   Thank you!  Marena Chancy, PGY-3 Family Medicine Resident

## 2012-12-13 NOTE — Telephone Encounter (Signed)
Spoke with patient and informed her of below 

## 2012-12-31 ENCOUNTER — Other Ambulatory Visit: Payer: Self-pay | Admitting: Family Medicine

## 2013-02-13 ENCOUNTER — Emergency Department (HOSPITAL_COMMUNITY)
Admission: EM | Admit: 2013-02-13 | Discharge: 2013-02-14 | Disposition: A | Discharge status: Home / Self Care | Payer: Medicare Other | Attending: Emergency Medicine | Admitting: Emergency Medicine

## 2013-02-13 ENCOUNTER — Encounter (HOSPITAL_COMMUNITY): Payer: Self-pay | Admitting: Emergency Medicine

## 2013-02-13 DIAGNOSIS — F329 Major depressive disorder, single episode, unspecified: Secondary | ICD-10-CM | POA: Insufficient documentation

## 2013-02-13 DIAGNOSIS — K219 Gastro-esophageal reflux disease without esophagitis: Secondary | ICD-10-CM | POA: Diagnosis not present

## 2013-02-13 DIAGNOSIS — R509 Fever, unspecified: Secondary | ICD-10-CM | POA: Diagnosis not present

## 2013-02-13 DIAGNOSIS — K920 Hematemesis: Secondary | ICD-10-CM | POA: Insufficient documentation

## 2013-02-13 DIAGNOSIS — F17200 Nicotine dependence, unspecified, uncomplicated: Secondary | ICD-10-CM | POA: Insufficient documentation

## 2013-02-13 DIAGNOSIS — R197 Diarrhea, unspecified: Secondary | ICD-10-CM | POA: Insufficient documentation

## 2013-02-13 DIAGNOSIS — R1011 Right upper quadrant pain: Secondary | ICD-10-CM | POA: Diagnosis not present

## 2013-02-13 DIAGNOSIS — K7689 Other specified diseases of liver: Secondary | ICD-10-CM | POA: Insufficient documentation

## 2013-02-13 DIAGNOSIS — R112 Nausea with vomiting, unspecified: Secondary | ICD-10-CM | POA: Diagnosis not present

## 2013-02-13 DIAGNOSIS — F3289 Other specified depressive episodes: Secondary | ICD-10-CM | POA: Insufficient documentation

## 2013-02-13 DIAGNOSIS — K760 Fatty (change of) liver, not elsewhere classified: Secondary | ICD-10-CM

## 2013-02-13 DIAGNOSIS — J449 Chronic obstructive pulmonary disease, unspecified: Secondary | ICD-10-CM | POA: Insufficient documentation

## 2013-02-13 DIAGNOSIS — J4489 Other specified chronic obstructive pulmonary disease: Secondary | ICD-10-CM | POA: Insufficient documentation

## 2013-02-13 DIAGNOSIS — F411 Generalized anxiety disorder: Secondary | ICD-10-CM | POA: Diagnosis not present

## 2013-02-13 DIAGNOSIS — I10 Essential (primary) hypertension: Secondary | ICD-10-CM | POA: Diagnosis not present

## 2013-02-13 DIAGNOSIS — R748 Abnormal levels of other serum enzymes: Secondary | ICD-10-CM | POA: Diagnosis not present

## 2013-02-13 DIAGNOSIS — Z79899 Other long term (current) drug therapy: Secondary | ICD-10-CM | POA: Diagnosis not present

## 2013-02-13 DIAGNOSIS — G43909 Migraine, unspecified, not intractable, without status migrainosus: Secondary | ICD-10-CM | POA: Insufficient documentation

## 2013-02-13 LAB — URINALYSIS, ROUTINE W REFLEX MICROSCOPIC
Glucose, UA: NEGATIVE mg/dL
Hgb urine dipstick: NEGATIVE
KETONES UR: NEGATIVE mg/dL
Leukocytes, UA: NEGATIVE
NITRITE: NEGATIVE
Protein, ur: 30 mg/dL — AB
SPECIFIC GRAVITY, URINE: 1.023 (ref 1.005–1.030)
UROBILINOGEN UA: 0.2 mg/dL (ref 0.0–1.0)
pH: 6 (ref 5.0–8.0)

## 2013-02-13 LAB — COMPREHENSIVE METABOLIC PANEL
ALT: 51 U/L — AB (ref 0–35)
AST: 64 U/L — AB (ref 0–37)
Albumin: 3.8 g/dL (ref 3.5–5.2)
Alkaline Phosphatase: 83 U/L (ref 39–117)
BILIRUBIN TOTAL: 0.3 mg/dL (ref 0.3–1.2)
BUN: 11 mg/dL (ref 6–23)
CHLORIDE: 97 meq/L (ref 96–112)
CO2: 22 mEq/L (ref 19–32)
Calcium: 9.1 mg/dL (ref 8.4–10.5)
Creatinine, Ser: 0.63 mg/dL (ref 0.50–1.10)
GFR calc Af Amer: >90 mL/min (ref >90)
GFR calc non Af Amer: >90 mL/min (ref >90)
Glucose, Bld: 137 mg/dL — ABNORMAL HIGH (ref 70–99)
Potassium: 3.7 mEq/L (ref 3.7–5.3)
SODIUM: 139 meq/L (ref 137–147)
Total Protein: 8.1 g/dL (ref 6.0–8.3)

## 2013-02-13 LAB — POCT I-STAT, CHEM 8
BUN: 9 mg/dL (ref 6–23)
Calcium, Ion: 1.1 mmol/L — ABNORMAL LOW (ref 1.12–1.23)
Chloride: 99 mEq/L (ref 96–112)
Creatinine, Ser: 0.7 mg/dL (ref 0.50–1.10)
GLUCOSE: 153 mg/dL — AB (ref 70–99)
HCT: 52 % — ABNORMAL HIGH (ref 36.0–46.0)
HEMOGLOBIN: 17.7 g/dL — AB (ref 12.0–15.0)
Potassium: 2.9 mEq/L — CL (ref 3.7–5.3)
Sodium: 139 mEq/L (ref 137–147)
TCO2: 23 mmol/L (ref 0–100)

## 2013-02-13 LAB — CBC WITH DIFFERENTIAL/PLATELET
BASOS ABS: 0 10*3/uL (ref 0.0–0.1)
BASOS PCT: 0 % (ref 0–1)
Eosinophils Absolute: 0.1 10*3/uL (ref 0.0–0.7)
Eosinophils Relative: 1 % (ref 0–5)
HEMATOCRIT: 46.8 % — AB (ref 36.0–46.0)
Hemoglobin: 16.5 g/dL — ABNORMAL HIGH (ref 12.0–15.0)
Lymphocytes Relative: 34 % (ref 12–46)
Lymphs Abs: 2.3 10*3/uL (ref 0.7–4.0)
MCH: 32.6 pg (ref 26.0–34.0)
MCHC: 35.3 g/dL (ref 30.0–36.0)
MCV: 92.5 fL (ref 78.0–100.0)
MONO ABS: 0.7 10*3/uL (ref 0.1–1.0)
Monocytes Relative: 10 % (ref 3–12)
NEUTROS ABS: 3.8 10*3/uL (ref 1.7–7.7)
Neutrophils Relative %: 55 % (ref 43–77)
PLATELETS: 300 10*3/uL (ref 150–400)
RBC: 5.06 MIL/uL (ref 3.87–5.11)
RDW: 13.2 % (ref 11.5–15.5)
WBC: 6.8 10*3/uL (ref 4.0–10.5)

## 2013-02-13 LAB — URINE MICROSCOPIC-ADD ON

## 2013-02-13 MED ORDER — ONDANSETRON 4 MG PO TBDP
8.0000 mg | ORAL_TABLET | Freq: Once | ORAL | Status: AC
  Administered 2013-02-13: 8 mg via ORAL
  Filled 2013-02-13: qty 2

## 2013-02-13 MED ORDER — SODIUM CHLORIDE 0.9 % IV BOLUS (SEPSIS)
1000.0000 mL | Freq: Once | INTRAVENOUS | Status: AC
  Administered 2013-02-13: 1000 mL via INTRAVENOUS

## 2013-02-13 NOTE — ED Notes (Signed)
Pt with vomiting, diarrhea and abdominal pain for several days.  Complains also of mild fevers and body aches.

## 2013-02-13 NOTE — ED Notes (Signed)
Dr. Doy Mince aware of K 2.9 mmol/L as well as triage nurse Mortimer Fries

## 2013-02-13 NOTE — ED Notes (Signed)
Ultrasound reports there are 2 patients ahead of patient to be transported to [place] and it will be about a little over an hour until she is transported. Patient and family updated.

## 2013-02-13 NOTE — ED Provider Notes (Signed)
CSN: [id number]     Arrival date & time 02/13/13  1835 History   First MD Initiated Contact with Patient 02/13/13 2112     Chief Complaint  Patient presents with  . Emesis  . Diarrhea     (Consider location/radiation/quality/duration/timing/severity/associated sxs/prior Treatment) Patient is a 58 y.o. female presenting with vomiting.  Emesis Severity:  Severe Duration:  3 days Timing:  Intermittent Quality:  Stomach contents and bright red blood Progression:  Improving Chronicity:  New Relieved by:  Antiemetics Worsened by:  Liquids Associated symptoms: abdominal pain, diarrhea and fever (subjective)   Abdominal pain:    Location:  Generalized   Quality:  Cramping   Severity:  Moderate   Onset quality:  Gradual   Duration:  3 days   Timing:  Constant   Progression:  Unchanged   Chronicity:  New Diarrhea:    Quality:  Watery   Severity:  Severe   Duration:  3 days   Timing:  Constant   Progression:  Unchanged Risk factors: no sick contacts     Past Medical History  Diagnosis Date  . Hypertension   . Anxiety   . Depression 2008  . COPD (chronic obstructive pulmonary disease) 2011  . GERD (gastroesophageal reflux disease) 1994  . Migraine 2008   Past Surgical History  Procedure Laterality Date  . Appendectomy  1975  . Cesarean section  1994  . Knee surgery Right 2012    meniscal tear repair  . Carpal tunnel release     Family History  Problem Relation Age of Onset  . Cancer Mother 79    colon  . Hyperlipidemia Mother   . Cancer Sister     breast  . Diabetes Sister   . Hyperlipidemia Sister    History  Substance Use Topics  . Smoking status: Current Every Day Smoker -- 1.00 packs/day    Types: Cigarettes  . Smokeless tobacco: Not on file  . Alcohol Use: No   OB History   Grav Para Term Preterm Abortions TAB SAB Ect Mult Living                 Review of Systems  HENT: Negative for congestion.   Respiratory: Negative for cough and shortness  of breath.   Cardiovascular: Negative for chest pain.  Gastrointestinal: Positive for nausea, vomiting, abdominal pain and diarrhea.  All other systems reviewed and are negative.      Allergies  Statins; Amoxicillin; and Sulfa antibiotics  Home Medications   Current Outpatient Rx  Name  Route  Sig  Dispense  Refill  . atenolol (TENORMIN) 50 MG tablet   Oral   Take 1 tablet (50 mg total) by mouth 2 (two) times daily.   60 tablet   5   . Omega-3 Fatty Acids (FISH OIL PO)   Oral   Take 1 capsule by mouth daily.         Marland Kitchen omeprazole (PRILOSEC) 20 MG capsule   Oral   Take 20 mg by mouth daily.         Marland Kitchen topiramate (TOPAMAX) 100 MG tablet   Oral   Take 100 mg by mouth 2 (two) times daily.         Marland Kitchen venlafaxine XR (EFFEXOR-XR) 75 MG 24 hr capsule   Oral   Take 225 mg by mouth daily with breakfast.          BP 131/53  Pulse 75  Temp(Src) 98.5 F (36.9 C) (Oral)  Resp  18  Ht 5\' 5"  (1.651 m)  Wt 217 lb (98.431 kg)  BMI 36.11 kg/m2  SpO2 96% Physical Exam  Nursing note and vitals reviewed. Constitutional: She is oriented to person, place, and time. She appears well-developed and well-nourished. No distress.  HENT:  Head: Normocephalic and atraumatic.  Mouth/Throat: Mucous membranes are dry.  Eyes: Conjunctivae are normal. Pupils are equal, round, and reactive to light. No scleral icterus.  Neck: Neck supple.  Cardiovascular: Normal rate, regular rhythm, normal heart sounds and intact distal pulses.   No murmur heard. Pulmonary/Chest: Effort normal and breath sounds normal. No stridor. No respiratory distress. She has no rales.  Abdominal: Soft. Bowel sounds are normal. She exhibits no distension. There is tenderness (mild generalized tenderness, but moderate tenderness in RUQ) in the right upper quadrant. There is no rigidity, no rebound and no guarding.  Musculoskeletal: Normal range of motion.  Neurological: She is alert and oriented to person, place, and  time.  Skin: Skin is warm and dry. No rash noted.  Psychiatric: She has a normal mood and affect. Her behavior is normal.    ED Course  Procedures (including critical care time) Labs Review Labs Reviewed  CBC WITH DIFFERENTIAL - Abnormal; Notable for the following:    Hemoglobin 16.5 (*)    HCT 46.8 (*)    All other components within normal limits  URINALYSIS, ROUTINE W REFLEX MICROSCOPIC - Abnormal; Notable for the following:    Color, Urine AMBER (*)    APPearance CLOUDY (*)    Bilirubin Urine SMALL (*)    Protein, ur 30 (*)    All other components within normal limits  URINE MICROSCOPIC-ADD ON - Abnormal; Notable for the following:    Casts HYALINE CASTS (*)    All other components within normal limits  COMPREHENSIVE METABOLIC PANEL - Abnormal; Notable for the following:    Glucose, Bld 137 (*)    AST 64 (*)    ALT 51 (*)    All other components within normal limits  POCT I-STAT, CHEM 8 - Abnormal; Notable for the following:    Potassium 2.9 (*)    Glucose, Bld 153 (*)    Calcium, Ion 1.10 (*)    Hemoglobin 17.7 (*)    HCT 52.0 (*)    All other components within normal limits   Imaging Review No results found.  EKG Interpretation   None       MDM   Final diagnoses:  Nausea vomiting and diarrhea  Elevated liver enzymes   58 yo female with 3 days of vomiting and diarrhea.  Also complains of some generalized cramping abdominal pain.  Presented today because she saw streaks of blood in her last episode of vomiting.  No history of GI bleeding.  No chest pain or SOB.  I think the blood is likely a Mallory Weiss tear.  She has had no further bloody vomiting.  On exam, well appearing, laughing with her family at bedside, nontoxic.  Mildly dehydrated.  Abdomen displays RUQ tenderness.  Symptoms likely a prolonged gastroenteritis.  However, due to RUQ pain with vomiting, will obtain RUQ [place].  Symptoms much better after ODT zofran.  Will also give IV fluids.      Houston Siren III, MD 02/14/13 [phone]

## 2013-02-14 ENCOUNTER — Emergency Department (HOSPITAL_COMMUNITY): Payer: Medicare Other

## 2013-02-14 MED ORDER — POTASSIUM CHLORIDE 10 MEQ/100ML IV SOLN
10.0000 meq | Freq: Once | INTRAVENOUS | Status: AC
  Administered 2013-02-14: 10 meq via INTRAVENOUS
  Filled 2013-02-14: qty 100

## 2013-02-14 MED ORDER — POTASSIUM CHLORIDE CRYS ER 20 MEQ PO TBCR
40.0000 meq | EXTENDED_RELEASE_TABLET | Freq: Once | ORAL | Status: AC
  Administered 2013-02-14: 40 meq via ORAL
  Filled 2013-02-14: qty 2

## 2013-02-14 MED ORDER — FENTANYL CITRATE 0.05 MG/ML IJ SOLN
50.0000 ug | INTRAMUSCULAR | Status: DC | PRN
  Administered 2013-02-14: 50 ug via INTRAVENOUS
  Filled 2013-02-14: qty 2

## 2013-02-14 MED ORDER — ONDANSETRON HCL 4 MG PO TABS: 4.0000 mg | ORAL_TABLET | Freq: Four times a day (QID) | ORAL | Status: DC

## 2013-02-14 MED ORDER — ONDANSETRON HCL 4 MG/2ML IJ SOLN
4.0000 mg | Freq: Once | INTRAMUSCULAR | Status: AC
  Administered 2013-02-14: 4 mg via INTRAVENOUS
  Filled 2013-02-14: qty 2

## 2013-02-14 NOTE — ED Notes (Signed)
Asked patient if she wants me to ask the EDP for pain medication and pt reports she did not want any pain medication.

## 2013-02-14 NOTE — ED Notes (Addendum)
Dr. Opitz at bedside. 

## 2013-02-14 NOTE — ED Notes (Signed)
Pt reports she thinks her nausea is coming back. Dr. Doy Mince made aware and verbally ordered 4mg  Zofran IV.

## 2013-02-14 NOTE — ED Notes (Signed)
Escorted patient out of ED via wheelchair and assisted patient into friends car.

## 2013-02-14 NOTE — ED Provider Notes (Signed)
5:02 AM patient feeling significantly better, still has very mild nausea and minimal pain. No further hematemesis. Abdominal exam soft, nontender without acute abdomen. Zofran fentanyl provided. Ultrasound results shared with patient as below - fatty liver with mildly elevated liver enzymes. Plan discharge home with prescription for Zofran, dehydration precautions, followup primary care physician.    Results for orders placed during the hospital encounter of 02/13/13  CBC WITH DIFFERENTIAL      Result Value Ref Range   WBC 6.8  4.0 - 10.5 K/uL   RBC 5.06  3.87 - 5.11 MIL/uL   Hemoglobin 16.5 (*) 12.0 - 15.0 g/dL   HCT 46.8 (*) 36.0 - 46.0 %   MCV 92.5  78.0 - 100.0 fL   MCH 32.6  26.0 - 34.0 pg   MCHC 35.3  30.0 - 36.0 g/dL   RDW 13.2  11.5 - 15.5 %   Platelets 300  150 - 400 K/uL   Neutrophils Relative % 55  43 - 77 %   Neutro Abs 3.8  1.7 - 7.7 K/uL   Lymphocytes Relative 34  12 - 46 %   Lymphs Abs 2.3  0.7 - 4.0 K/uL   Monocytes Relative 10  3 - 12 %   Monocytes Absolute 0.7  0.1 - 1.0 K/uL   Eosinophils Relative 1  0 - 5 %   Eosinophils Absolute 0.1  0.0 - 0.7 K/uL   Basophils Relative 0  0 - 1 %   Basophils Absolute 0.0  0.0 - 0.1 K/uL  URINALYSIS, ROUTINE W REFLEX MICROSCOPIC      Result Value Ref Range   Color, Urine AMBER (*) YELLOW   APPearance CLOUDY (*) CLEAR   Specific Gravity, Urine 1.023  1.005 - 1.030   pH 6.0  5.0 - 8.0   Glucose, UA NEGATIVE  NEGATIVE mg/dL   Hgb urine dipstick NEGATIVE  NEGATIVE   Bilirubin Urine SMALL (*) NEGATIVE   Ketones, ur NEGATIVE  NEGATIVE mg/dL   Protein, ur 30 (*) NEGATIVE mg/dL   Urobilinogen, UA 0.2  0.0 - 1.0 mg/dL   Nitrite NEGATIVE  NEGATIVE   Leukocytes, UA NEGATIVE  NEGATIVE  URINE MICROSCOPIC-ADD ON      Result Value Ref Range   Squamous Epithelial / LPF RARE  RARE   WBC, UA 3-6  <3 WBC/hpf   RBC / HPF 0-2  <3 RBC/hpf   Bacteria, UA RARE  RARE   Casts HYALINE CASTS (*) NEGATIVE   Urine-Other MUCOUS PRESENT     COMPREHENSIVE METABOLIC PANEL      Result Value Ref Range   Sodium 139  137 - 147 mEq/L   Potassium 3.7  3.7 - 5.3 mEq/L   Chloride 97  96 - 112 mEq/L   CO2 22  19 - 32 mEq/L   Glucose, Bld 137 (*) 70 - 99 mg/dL   BUN 11  6 - 23 mg/dL   Creatinine, Ser 0.63  0.50 - 1.10 mg/dL   Calcium 9.1  8.4 - 10.5 mg/dL   Total Protein 8.1  6.0 - 8.3 g/dL   Albumin 3.8  3.5 - 5.2 g/dL   AST 64 (*) 0 - 37 U/L   ALT 51 (*) 0 - 35 U/L   Alkaline Phosphatase 83  39 - 117 U/L   Total Bilirubin 0.3  0.3 - 1.2 mg/dL   GFR calc non Af Amer >90  >90 mL/min   GFR calc Af Amer >90  >90 mL/min  POCT I-STAT,  CHEM 8      Result Value Ref Range   Sodium 139  137 - 147 mEq/L   Potassium 2.9 (*) 3.7 - 5.3 mEq/L   Chloride 99  96 - 112 mEq/L   BUN 9  6 - 23 mg/dL   Creatinine, Ser 0.70  0.50 - 1.10 mg/dL   Glucose, Bld 153 (*) 70 - 99 mg/dL   Calcium, Ion 1.10 (*) 1.12 - 1.23 mmol/L   TCO2 23  0 - 100 mmol/L   Hemoglobin 17.7 (*) 12.0 - 15.0 g/dL   HCT 52.0 (*) 36.0 - 46.0 %   Comment NOTIFIED PHYSICIAN     US Abdomen Complete  02/14/2013   CLINICAL DATA:  Right upper quadrant pain  EXAM: ULTRASOUND ABDOMEN COMPLETE  COMPARISON:  None.  FINDINGS: Gallbladder:  No gallstones or wall thickening visualized. No sonographic Murphy sign noted.  Common bile duct:  Not visualized  Liver:  There is marked diffuse increased echotexture of the liver.  IVC:  No abnormality visualized.  Pancreas:  Limited visualization.  Spleen:  Size and appearance within normal limits.  Right Kidney:  Length: 12.4 cm. Echogenicity within normal limits. No mass or hydronephrosis visualized.  Left Kidney:  Length: 14.3 cm. Echogenicity within normal limits. No hydronephrosis visualized. There is a 2.3 x 3.2 x 1.9 cm upper pole peripelvic renal cyst.  Abdominal aorta:  No aneurysm visualized.  Other findings:  None.  IMPRESSION: Normal gallbladder. No acute abnormality identified in the abdomen. Diffuse increased echotexture of the liver  nonspecific, but can be seen in fatty infiltration of liver.   Electronically Signed   By: Abelardo Diesel M.D.   On: 02/14/2013 02:26      Teressa Lower, MD 02/14/13 [phone]

## 2013-02-14 NOTE — Discharge Instructions (Signed)
Diet for Diarrhea, Adult  Frequent, runny stools (diarrhea) may be caused or worsened by food or drink. Diarrhea may be relieved by changing your diet. Since diarrhea can last up to 7 days, it is easy for you to lose too much fluid from the body and become dehydrated. Fluids that are lost need to be replaced. Along with a modified diet, make sure you drink enough fluids to keep your urine clear or pale yellow.  DIET INSTRUCTIONS  · Ensure adequate fluid intake (hydration): have 1 cup (8 oz) of fluid for each diarrhea episode. Avoid fluids that contain simple sugars or sports drinks, fruit juices, whole milk products, and sodas. Your urine should be clear or pale yellow if you are drinking enough fluids. Hydrate with an oral rehydration solution that you can purchase at pharmacies, retail stores, and online. You can prepare an oral rehydration solution at home by mixing the following ingredients together:  ·   tsp table salt.  · ¾ tsp baking soda.  ·  tsp salt substitute containing potassium chloride.  · 1  tablespoons sugar.  · 1 L (34 oz) of water.  · Certain foods and beverages may increase the speed at which food moves through the gastrointestinal (GI) tract. These foods and beverages should be avoided and include:  · Caffeinated and alcoholic beverages.  · High-fiber foods, such as raw fruits and vegetables, nuts, seeds, and whole grain breads and cereals.  · Foods and beverages sweetened with sugar alcohols, such as xylitol, sorbitol, and mannitol.  · Some foods may be well tolerated and may help thicken stool including:  · Starchy foods, such as rice, toast, pasta, low-sugar cereal, oatmeal, grits, baked potatoes, crackers, and bagels.    · Bananas.    · Applesauce.  · Add probiotic-rich foods to help increase healthy bacteria in the GI tract, such as yogurt and fermented milk products.  RECOMMENDED FOODS AND BEVERAGES  Starches  Choose foods with less than 2 g of fiber per serving.  · Recommended:  White,  French, and pita breads, plain rolls, buns, bagels. Plain muffins, matzo. Soda, saltine, or graham crackers. Pretzels, melba toast, zwieback. Cooked cereals made with water: cornmeal, farina, cream cereals. Dry cereals: refined corn, wheat, rice. Potatoes prepared any way without skins, refined macaroni, spaghetti, noodles, refined rice.  · Avoid:  Bread, rolls, or crackers made with whole wheat, multi-grains, rye, bran seeds, nuts, or coconut. Corn tortillas or taco shells. Cereals containing whole grains, multi-grains, bran, coconut, nuts, raisins. Cooked or dry oatmeal. Coarse wheat cereals, granola. Cereals advertised as "high-fiber." Potato skins. Whole grain pasta, wild or brown rice. Popcorn. Sweet potatoes, yams. Sweet rolls, doughnuts, waffles, pancakes, sweet breads.  Vegetables  · Recommended: Strained tomato and vegetable juices. Most well-cooked and canned vegetables without seeds. Fresh: Tender lettuce, cucumber without the skin, cabbage, spinach, bean sprouts.  · Avoid: Fresh, cooked, or canned: Artichokes, baked beans, beet greens, broccoli, Brussels sprouts, corn, kale, legumes, peas, sweet potatoes. Cooked: Green or red cabbage, spinach. Avoid large servings of any vegetables because vegetables shrink when cooked, and they contain more fiber per serving than fresh vegetables.  Fruit  · Recommended: Cooked or canned: Apricots, applesauce, cantaloupe, cherries, fruit cocktail, grapefruit, grapes, kiwi, mandarin oranges, peaches, pears, plums, watermelon. Fresh: Apples without skin, ripe banana, grapes, cantaloupe, cherries, grapefruit, peaches, oranges, plums. Keep servings limited to ½ cup or 1 piece.  · Avoid: Fresh: Apples with skin, apricots, mangoes, pears, raspberries, strawberries. Prune juice, stewed or dried prunes. Dried   fruits, raisins, dates. Large servings of all fresh fruits.  Protein  · Recommended: Ground or well-cooked tender beef, ham, veal, lamb, pork, or poultry. Eggs. Fish,  oysters, shrimp, lobster, other seafoods. Liver, organ meats.  · Avoid: Tough, fibrous meats with gristle. Peanut butter, smooth or chunky. Cheese, nuts, seeds, legumes, dried peas, beans, lentils.  Dairy  · Recommended: Yogurt, lactose-free milk, kefir, drinkable yogurt, buttermilk, soy milk, or plain hard cheese.  · Avoid: Milk, chocolate milk, beverages made with milk, such as milkshakes.  Soups  · Recommended: Bouillon, broth, or soups made from allowed foods. Any strained soup.  · Avoid: Soups made from vegetables that are not allowed, cream or milk-based soups.  Desserts and Sweets  · Recommended: Sugar-free gelatin, sugar-free frozen ice pops made without sugar alcohol.  · Avoid: Plain cakes and cookies, pie made with fruit, pudding, custard, cream pie. Gelatin, fruit, ice, sherbet, frozen ice pops. Ice cream, ice milk without nuts. Plain hard candy, honey, jelly, molasses, syrup, sugar, chocolate syrup, gumdrops, marshmallows.  Fats and Oils  · Recommended: Limit fats to less than 8 tsp per day.  · Avoid: Seeds, nuts, olives, avocados. Margarine, butter, cream, mayonnaise, salad oils, plain salad dressings. Plain gravy, crisp bacon without rind.  Beverages  · Recommended: Water, decaffeinated teas, oral rehydration solutions, sugar-free beverages not sweetened with sugar alcohols.  · Avoid: Fruit juices, caffeinated beverages (coffee, tea, soda), alcohol, sports drinks, or lemon-lime soda.  Condiments  · Recommended: Ketchup, mustard, horseradish, vinegar, cocoa powder. Spices in moderation: allspice, basil, bay leaves, celery powder or leaves, cinnamon, cumin powder, curry powder, ginger, mace, marjoram, onion or garlic powder, oregano, paprika, parsley flakes, ground pepper, rosemary, sage, savory, tarragon, thyme, turmeric.  · Avoid: Coconut, honey.  Document Released: 03/11/2003 Document Revised: 09/13/2011 Document Reviewed: 05/05/2011  [employer]® Patient Information ©2014 [employer].

## 2013-02-14 NOTE — ED Notes (Signed)
Asked patient if she wants me to ask the EDP for pain medication and pt reports she did not want any pain medication.  

## 2013-02-14 NOTE — ED Notes (Signed)
Ultrasound tech at bedside

## 2013-02-14 NOTE — ED Notes (Signed)
This RN called [place] to determine how much longer until patient is transported to [place]. Was told there is one person ahead of this patient. Patient and family updated.

## 2013-02-14 NOTE — ED Notes (Signed)
Pts friend Shirlean Mylar is driving patient home.  Pt A&Ox4.

## 2013-02-25 ENCOUNTER — Ambulatory Visit: Payer: PRIVATE HEALTH INSURANCE | Admitting: Family Medicine

## 2013-03-05 ENCOUNTER — Encounter: Payer: Self-pay | Admitting: Family Medicine

## 2013-03-05 ENCOUNTER — Ambulatory Visit (INDEPENDENT_AMBULATORY_CARE_PROVIDER_SITE_OTHER): Payer: Medicare Other | Admitting: Family Medicine

## 2013-03-05 ENCOUNTER — Ambulatory Visit
Admission: RE | Admit: 2013-03-05 | Discharge: 2013-03-05 | Disposition: A | Discharge status: Home / Self Care | Payer: Medicare Other | Source: Ambulatory Visit | Attending: Family Medicine | Admitting: Family Medicine

## 2013-03-05 VITALS — BP 112/75 | HR 65 | Temp 99.2°F | Ht 65.0 in | Wt 218.9 lb

## 2013-03-05 DIAGNOSIS — N3946 Mixed incontinence: Secondary | ICD-10-CM

## 2013-03-05 DIAGNOSIS — M79645 Pain in left finger(s): Secondary | ICD-10-CM

## 2013-03-05 DIAGNOSIS — N3941 Urge incontinence: Secondary | ICD-10-CM

## 2013-03-05 DIAGNOSIS — M79609 Pain in unspecified limb: Secondary | ICD-10-CM

## 2013-03-05 DIAGNOSIS — I10 Essential (primary) hypertension: Secondary | ICD-10-CM

## 2013-03-05 LAB — COMPLETE METABOLIC PANEL WITH GFR
ALT: 15 U/L (ref 0–35)
AST: 18 U/L (ref 0–37)
Albumin: 4.2 g/dL (ref 3.5–5.2)
Alkaline Phosphatase: 78 U/L (ref 39–117)
BILIRUBIN TOTAL: 0.3 mg/dL (ref 0.2–1.2)
BUN: 8 mg/dL (ref 6–23)
CO2: 27 mEq/L (ref 19–32)
Calcium: 9.6 mg/dL (ref 8.4–10.5)
Chloride: 106 mEq/L (ref 96–112)
Creat: 0.6 mg/dL (ref 0.50–1.10)
GFR, Est Non African American: >89 mL/min
Glucose, Bld: 85 mg/dL (ref 70–99)
Potassium: 4.5 mEq/L (ref 3.5–5.3)
SODIUM: 141 meq/L (ref 135–145)
TOTAL PROTEIN: 6.9 g/dL (ref 6.0–8.3)

## 2013-03-05 LAB — LIPID PANEL
Cholesterol: 240 mg/dL — ABNORMAL HIGH (ref 0–200)
HDL: 31 mg/dL — AB (ref >39)
LDL CALC: 164 mg/dL — AB (ref 0–99)
Total CHOL/HDL Ratio: 7.7 Ratio
Triglycerides: 225 mg/dL — ABNORMAL HIGH (ref <150)
VLDL: 45 mg/dL — ABNORMAL HIGH (ref 0–40)

## 2013-03-05 LAB — POCT GLYCOSYLATED HEMOGLOBIN (HGB A1C): Hemoglobin A1C: 5.8

## 2013-03-05 NOTE — Patient Instructions (Signed)
Follow up for your well woman exam.   I will call you with the results.

## 2013-03-05 NOTE — Assessment & Plan Note (Addendum)
Well controlled on atenolol. Continue current therapy.  Patient to return for fasting lipids. Will check A1C and lipid panel today.

## 2013-03-05 NOTE — Assessment & Plan Note (Signed)
Associated with swelling, limited movement.  Xray to evaluate for fracture.

## 2013-03-05 NOTE — Progress Notes (Signed)
Patient ID: CLARITY CISZEK    DOB: 04-24-1955, 58 y.o.   MRN: [id number] --- Subjective:  Marie [name] is a 58 y.o.female who presents for follow up on recent hospital visit. Her concerns include the following:   - hospital f/u: was seen in the ED on 02/13/13 with nausea, vomiting, hematemesis and abdominal pain thought to be viral in nature. She was found to have elevated LFT's and changes on abdominal US consistent with fatty liver changes.  Since then, she denies any pain, N/V. She has been able to eat and drink without any difficulty.  - urinary urgency with incontinence: she reports a feeling of urgency. She also reports not being able to make it to the toilet on time and having accidents at least once a day. This has been ongoing for a few months. She denies any dysuria or abdominal pain. She reports increased thirst. She urinates multiple times per day and a couple of times per night.   - left thumb pain: accident occurred a week ago when her dog ran into it. The thumb bent backwards and popped twice. Since then, she has had pain in the interphalangeal joint and swelling. Not able to bend thumb. She fitted a splint to it for a few days but was hindered by it and therefore took it off.   ROS: see HPI Past Medical History: reviewed and updated medications and allergies. Social History: Tobacco: current every day smoker  Objective: Filed Vitals:   03/05/13 1103  BP: 112/75  Pulse: 65  Temp: 99.2 F (37.3 C)    Physical Examination:   General appearance - alert, well appearing, and in no distress Chest - clear to auscultation, no wheezes, rales or rhonchi, symmetric air entry Heart - normal rate, regular rhythm, normal S1, S2, no murmurs Abdomen - soft, nontender, nondistended, no masses or organomegaly, negative Murphy's.  Thumb - left: tenderness along the 1st MCP joint and 1st interphalangeal joint. Soft tissue swelling present. Able to minimally flex at MCP, no movement with flexion  of interphalangeal joint. No tenderness along the anatomical snuffbox, normal radial pulse.

## 2013-03-05 NOTE — Assessment & Plan Note (Signed)
Significantly affecting patient with daily accidents. Patient does have a history of sling several years ago.  Will refer to urology with Dr. Matilde Sprang.  Check UA and A1C to make sure infection and hyperglycemia are not contributing respectively.

## 2013-03-06 ENCOUNTER — Encounter: Payer: Self-pay | Admitting: *Deleted

## 2013-03-06 ENCOUNTER — Telehealth: Payer: Self-pay | Admitting: Family Medicine

## 2013-03-06 DIAGNOSIS — S62509A Fracture of unspecified phalanx of unspecified thumb, initial encounter for closed fracture: Secondary | ICD-10-CM

## 2013-03-06 NOTE — Telephone Encounter (Signed)
Pt has an appt on 03/10/2013 at [facility] is aware.  [place]

## 2013-03-06 NOTE — Telephone Encounter (Signed)
FWD to [facility]

## 2013-03-06 NOTE — Telephone Encounter (Signed)
Please arrange for a hand surgery referral due to broken thumb in the left hand. This needs to be done in the next week if possible. If not possible, would like referral to ortho.   Called patient and left message letting her know that thumb is broken and that we are working on getting the referral set up for her.   Liam Graham, PGY-3 Family Medicine Resident

## 2013-03-07 ENCOUNTER — Telehealth: Payer: Self-pay | Admitting: Family Medicine

## 2013-03-07 DIAGNOSIS — E785 Hyperlipidemia, unspecified: Secondary | ICD-10-CM

## 2013-03-07 NOTE — Telephone Encounter (Signed)
Called patient to review her results.  Told her that A1C was in prediabetes category and that I recommended diet and exercise.  CMP normal.  Lipid panel showing high LDL and total chol. Patient cannot tolerate statins due to myalgias. Patient would like to work on diet and exercise and continue taking fish oil and recheck in 3 months. Agree with this as reasonable possibility.   Fasting lipid panel entered for 3 months from now.   Liam Graham, PGY-3 Family Medicine Resident

## 2013-03-17 ENCOUNTER — Other Ambulatory Visit: Payer: Self-pay | Admitting: Emergency Medicine

## 2013-03-17 ENCOUNTER — Other Ambulatory Visit: Payer: Self-pay | Admitting: Family Medicine

## 2013-03-20 ENCOUNTER — Encounter: Payer: Self-pay | Admitting: Internal Medicine

## 2013-03-21 ENCOUNTER — Other Ambulatory Visit: Payer: Self-pay | Admitting: *Deleted

## 2013-03-21 MED ORDER — ATENOLOL 50 MG PO TABS: 50.0000 mg | ORAL_TABLET | Freq: Two times a day (BID) | ORAL | Status: DC

## 2013-04-24 ENCOUNTER — Other Ambulatory Visit: Payer: Self-pay | Admitting: Family Medicine

## 2013-04-28 ENCOUNTER — Telehealth: Payer: Self-pay | Admitting: Family Medicine

## 2013-04-28 MED ORDER — TOPIRAMATE 100 MG PO TABS: 100.0000 mg | ORAL_TABLET | Freq: Two times a day (BID) | ORAL | Status: DC

## 2013-04-28 NOTE — Telephone Encounter (Signed)
Refill request for TOPAMAX

## 2013-04-28 NOTE — Telephone Encounter (Signed)
1 month refill provided. Last discussed in September 2014, would ask for appointment to be scheduled to follow up on migraines.

## 2013-04-29 NOTE — Telephone Encounter (Signed)
Called pt. LMVM to call back. Please tell pt, that Topamax was refilled and to keep appt 05/19/13 with Dr.Losq. Thanks. Mauricia Area

## 2013-05-19 ENCOUNTER — Other Ambulatory Visit (HOSPITAL_COMMUNITY)
Admission: RE | Admit: 2013-05-19 | Discharge: 2013-05-19 | Disposition: A | Discharge status: Home / Self Care | Payer: Medicare Other | Source: Ambulatory Visit | Attending: Family Medicine | Admitting: Family Medicine

## 2013-05-19 ENCOUNTER — Encounter: Payer: Self-pay | Admitting: Family Medicine

## 2013-05-19 ENCOUNTER — Ambulatory Visit (INDEPENDENT_AMBULATORY_CARE_PROVIDER_SITE_OTHER): Payer: Medicare Other | Admitting: Family Medicine

## 2013-05-19 VITALS — BP 121/80 | HR 66 | Temp 98.4°F | Ht 64.0 in | Wt 227.4 lb

## 2013-05-19 DIAGNOSIS — Z01419 Encounter for gynecological examination (general) (routine) without abnormal findings: Secondary | ICD-10-CM

## 2013-05-19 DIAGNOSIS — Z720 Tobacco use: Secondary | ICD-10-CM

## 2013-05-19 DIAGNOSIS — Z124 Encounter for screening for malignant neoplasm of cervix: Secondary | ICD-10-CM

## 2013-05-19 DIAGNOSIS — F17200 Nicotine dependence, unspecified, uncomplicated: Secondary | ICD-10-CM

## 2013-05-19 DIAGNOSIS — Z1151 Encounter for screening for human papillomavirus (HPV): Secondary | ICD-10-CM | POA: Insufficient documentation

## 2013-05-19 DIAGNOSIS — Z0000 Encounter for general adult medical examination without abnormal findings: Secondary | ICD-10-CM

## 2013-05-19 MED ORDER — VENLAFAXINE HCL ER 75 MG PO CP24: ORAL_CAPSULE | ORAL | Status: DC

## 2013-05-19 MED ORDER — ATENOLOL 50 MG PO TABS: 50.0000 mg | ORAL_TABLET | Freq: Two times a day (BID) | ORAL | Status: DC

## 2013-05-19 MED ORDER — TOPIRAMATE 100 MG PO TABS
100.0000 mg | ORAL_TABLET | Freq: Two times a day (BID) | ORAL | Status: DC
Start: 2013-05-19 — End: 2014-01-19

## 2013-05-19 NOTE — Patient Instructions (Signed)
For the smoking, make an appointment with Dr. Valentina Lucks with the pharmacy clinic to talk about medicines to help you quit.   Follow up in July to get a repeat cholesterol check after starting an exercise routine.   We will send you a letter with the results of the PAP smear

## 2013-05-19 NOTE — Progress Notes (Signed)
Patient ID: Marie [name]    DOB: 09-09-1955, 58 y.o.   MRN: [id number] --- Subjective:  Marie [name] is a 58 y.o.female who presents for a well woman exam.  - PAP: no h/o abnormal PAP. Last PAP: 12/17/07: Satisfactory for evaluation, endocervical/transformation zone component PRESENT NEGATIVE FOR INTRAEPITHELIAL LESIONS OR MALIGNANCY. - mammogram: 12/24/2007: normal - colonoscopy: 2008: 2 hyperplastic polyps.  h/o colon cancer in her mother at age 88yo - not sexually active, no vaginal discharge, itching or burning - urinary incontinence present and being evaluated by urology - Family History of breast cancer: sister diagnosed with breast cancer in her 81's. Still living. No other family members with breast, ovarian cancer.  - no history of gyn surgeries.    ROS: positive for headaches that are chronic and stable, numbness in bilateral hands from carpal tunnel, anxiety, stress and memory problems. No SI, no HI.  Also reports frequent urination.   Past Medical History: reviewed and updated medications and allergies. Social History: Tobacco: Current every day smoker. Smokes 1 pack per day. She is interested in quitting. She has tried to quit several times before. Longest time she quit was for 4 years. She always quit cold [place]. She would be interested this time in either patches or chantix.    Objective: Filed Vitals:   05/19/13 1140  BP: 121/80  Pulse: 66  Temp: 98.4 F (36.9 C)    Physical Examination:   General appearance - alert, well appearing, and in no distress Ears - bilateral TM's and external ear canals normal Nose - normal and patent, no erythema, discharge or polyps Mouth - mucous membranes moist, pharynx normal without lesions Neck - supple, no significant adenopathy Chest - clear to auscultation, no wheezes, rales or rhonchi, symmetric air entry Heart - normal rate, regular rhythm, normal S1, S2, no murmurs Abdomen - soft, nontender, nondistended, no masses or  organomegaly Extremities - no pedal edema Breasts: breasts appear normal, no suspicious masses, no skin or nipple changes or axillary nodes. Pelvic exam: normal external genitalia, vulva, vagina, cervix, uterus and adnexa.

## 2013-05-20 ENCOUNTER — Other Ambulatory Visit: Payer: Self-pay

## 2013-05-20 DIAGNOSIS — Z1231 Encounter for screening mammogram for malignant neoplasm of breast: Secondary | ICD-10-CM

## 2013-05-21 DIAGNOSIS — Z01419 Encounter for gynecological examination (general) (routine) without abnormal findings: Secondary | ICD-10-CM | POA: Insufficient documentation

## 2013-05-21 DIAGNOSIS — Z720 Tobacco use: Secondary | ICD-10-CM | POA: Insufficient documentation

## 2013-05-21 NOTE — Assessment & Plan Note (Addendum)
-   PAP today - mammogram information given. Strongly encouraged patient to get this done especially with her family history of sister with breast cancer.  - patient to contact her GI doctor since she is due for a colonoscopy with her mother's history of colon cancer.  - smoking cessation reviewed with patient and encouraged her to see Dr. Valentina Lucks for smoking cessation.

## 2013-05-21 NOTE — Assessment & Plan Note (Signed)
-   smoking cessation reviewed with patient and encouraged her to see Dr. Valentina Lucks for smoking cessation.

## 2013-05-23 ENCOUNTER — Encounter: Payer: Self-pay | Admitting: Family Medicine

## 2013-05-29 ENCOUNTER — Encounter: Payer: Self-pay | Admitting: Pharmacist

## 2013-05-29 ENCOUNTER — Ambulatory Visit (INDEPENDENT_AMBULATORY_CARE_PROVIDER_SITE_OTHER): Payer: Medicare Other | Admitting: Pharmacist

## 2013-05-29 VITALS — BP 133/84 | HR 68 | Ht 64.57 in | Wt 225.4 lb

## 2013-05-29 DIAGNOSIS — Z720 Tobacco use: Secondary | ICD-10-CM

## 2013-05-29 DIAGNOSIS — F17200 Nicotine dependence, unspecified, uncomplicated: Secondary | ICD-10-CM

## 2013-05-29 MED ORDER — BUPROPION HCL ER (XL) 150 MG PO TB24: 150.0000 mg | ORAL_TABLET | Freq: Every day | ORAL | Status: DC

## 2013-05-29 NOTE — Assessment & Plan Note (Signed)
Patient presents with moderate Nicotine Dependence of ~40 years duration in a patient who is an excellent candidate for success because of her past quitting attempts and readiness to quit.   We discussed options for smoking cessation such as nicotine gum/patch, bupropion, and Chantix. After the discussion with the patient, the plan is to initiate bupropion XL 150mg  with the instructions to take 1 tablet every morning for 7 days then 2 tablets every morning for 20 days. Denies history of seizures. Patient counseled on purpose, proper use, and potential adverse effects, including insomnia, and potential change in mood.  Patient also set a goal to smoke <5 cigs/day and determine a quit date prior to her next appointment.  Written information provided. Provided information on 1 800-QUIT NOW support program. F/U Rx Clinic Visit in 3 weeks. Total time in face-to-face counseling 30 minutes. Patient seen with  Garter, PharmD Candidate, [place] PharmD Resident.

## 2013-05-29 NOTE — Patient Instructions (Signed)
Thank you for coming into your appointment today!  A new medication, bupropion (Wellbutrin XL), has been called into your pharmacy. The instructions are to take 1 tablet every morning for 7 days then 2 tablets every morning for 20 days.  Prior to the next appointment, your goal is to try to smoke <5 cigs/day and set a quit date.   Please make an appointment to follow-up in the Pharmacy Clinic in 3 weeks.

## 2013-05-29 NOTE — Progress Notes (Signed)
S:  Patient arrives in good spirits and motivated to quit smoking. Patient arrives for evaluation/assistance with tobacco dependence.   Patient reports she started smoking around [age] years of age and averages around 1ppd. She has quit several times in the past without any aid with the longest duration of 7 years. Her main triggers have been life altering events that have caused large emotional shifts/changes. She craves a cigarette when she wakes up in the morning and after meals.  She is motivated to quit because of financial problems, health, and tired of smoking. She reports she has decreased the amount she smokes over the last month from 1ppd to 2/3ppd.  Age when started using tobacco on a daily basis [age]. Number of Cigarettes per day 20. Brand smoked Maverick and Menthol.    Smokes first cigarette immediately after waking. Denies waking to smoke / Smokes times per night.    Longest time ever been tobacco free 7 years. Rates IMPORTANCE of quitting tobacco on 1-10 scale of 10. Rates READINESS of quitting tobacco on 1-10 scale of 10. Rates CONFIDENCE of quitting tobacco on 1-10 scale of 10. Triggers to use tobacco include emotional shifts/changes   A/P: Patient presents with moderate Nicotine Dependence of ~40 years duration in a patient who is an excellent candidate for success because of her past quitting attempts and readiness to quit.     We discussed options for smoking cessation such as nicotine gum/patch, bupropion, and Chantix. After the discussion with the patient, the plan is to initiate bupropion XL 150mg  with the instructions to take 1 tablet every morning for 7 days then 2 tablets every morning for 20 days. Denies history of seizures. Patient counseled on purpose, proper use, and potential adverse effects, including insomnia, and potential change in mood.   Patient also set a goal to smoke <5 cigs/day and determine a quit date prior to her next appointment.   Written  information provided. Provided information on 1 800-QUIT NOW support program. F/U Rx Clinic Visit in 3 weeks. Total time in face-to-face counseling 30 minutes. Patient seen with  Garter, PharmD Candidate, [place] PharmD Resident.

## 2013-05-30 NOTE — Progress Notes (Signed)
Patient ID: Marie [name], female   DOB: 10/24/1955, 58 y.o.   MRN: [id number] Reviewed: Agree with Dr. Koval's documentation and management. 

## 2013-06-06 ENCOUNTER — Ambulatory Visit
Admission: RE | Admit: 2013-06-06 | Discharge: 2013-06-06 | Disposition: A | Discharge status: Home / Self Care | Payer: Medicare Other | Source: Ambulatory Visit

## 2013-06-06 DIAGNOSIS — Z1231 Encounter for screening mammogram for malignant neoplasm of breast: Secondary | ICD-10-CM

## 2013-06-11 ENCOUNTER — Encounter: Payer: Self-pay | Admitting: Family Medicine

## 2013-06-11 ENCOUNTER — Ambulatory Visit (INDEPENDENT_AMBULATORY_CARE_PROVIDER_SITE_OTHER): Payer: Medicare Other | Admitting: Family Medicine

## 2013-06-11 VITALS — BP 126/76 | HR 73 | Temp 98.3°F | Ht 64.0 in | Wt 225.3 lb

## 2013-06-11 DIAGNOSIS — E785 Hyperlipidemia, unspecified: Secondary | ICD-10-CM

## 2013-06-11 NOTE — Patient Instructions (Signed)
Follow up in 3-4 months

## 2013-06-12 NOTE — Progress Notes (Signed)
Patient ID: Marie [name]    DOB: November 28, 1955, 58 y.o.   MRN: [id number] --- Subjective:  Marie [name] is a 58 y.o.female with h/o HTN, anxiety, tobacco abuse who presents for follow up on cholesterol. Patient states that she has been trying to watch her diet. She states that she typically eats one meal per day. Day prior to ofice visit, she had breakfast consisting of 2 eggs, bacon, grits and toast. She did not have anything else to eat for the rest of the day.  She reports that she has been on statins before but had npt tolerated them due to severe myalgias.     ROS: see HPI Past Medical History: reviewed and updated medications and allergies. Social History: Tobacco: trying to quit. Has 2 more packs of cigarettes and plans on quitting after those 2 packs are gone  Objective: Filed Vitals:   06/11/13 1125  BP: 126/76  Pulse: 73  Temp: 98.3 F (36.8 C)    Physical Examination:   General appearance - alert, well appearing, and in no distress Chest - clear to auscultation, no wheezes, rales or rhonchi, symmetric air entry Heart - normal rate, regular rhythm, normal S1, S2, no murmurs

## 2013-06-15 DIAGNOSIS — E785 Hyperlipidemia, unspecified: Secondary | ICD-10-CM | POA: Insufficient documentation

## 2013-06-15 NOTE — Assessment & Plan Note (Signed)
LDL in 160's at last check 3 months ago.Pt doesn't tolerate statins. She was unable to make large dietary changes.  Unable to repeat lipid panel due to insurance limitations.  Will likely need to send patient for lipid clinic to better manage her hyperlipidemia.

## 2013-06-16 ENCOUNTER — Ambulatory Visit: Payer: Medicare Other | Admitting: Pharmacist

## 2013-06-18 ENCOUNTER — Telehealth: Payer: Self-pay | Admitting: Family Medicine

## 2013-06-18 NOTE — Telephone Encounter (Signed)
Called patient and left message on voicemail asking for patient to call the clinic back with a good number where I can reach her.  When she calls back, please ask her the following and I will also follow up with her:  I would like to discuss her cholesterol medicine with her and consider starting her on a low dose statin once or twice a week and monitor her tolerance to it.   Thank you!  Liam Graham, PGY-3 Family Medicine Resident

## 2013-06-23 NOTE — Telephone Encounter (Signed)
Left another message for patient to return call. Busick, Robert Lee  

## 2013-06-25 NOTE — Telephone Encounter (Signed)
LVMOM for patient to return call, will await callback from patient.

## 2013-06-27 ENCOUNTER — Telehealth: Payer: Self-pay | Admitting: Family Medicine

## 2013-06-27 MED ORDER — FENOFIBRATE 145 MG PO TABS: 145.0000 mg | ORAL_TABLET | Freq: Every day | ORAL | Status: DC

## 2013-06-27 NOTE — Telephone Encounter (Signed)
Called patient to discuss starting her on low dose statin drug once or twice a week given her history of myalgias. She is very reluctant to do this and would prefer to try a non statin drug. Told her that another type of medication would be fenofibrate but that it is not as effective as statins in the treatment of elevated LDL and cardiovascular protection. She opted for trial of fenofibrate.  Will send Rx to pharmacy. Patient to follow up in 4 weeks with PCP to assess tolerance recheck direct LDL.   Liam Graham, PGY-3 Family Medicine Resident

## 2013-07-08 ENCOUNTER — Ambulatory Visit: Payer: Medicare Other

## 2013-07-08 ENCOUNTER — Ambulatory Visit (INDEPENDENT_AMBULATORY_CARE_PROVIDER_SITE_OTHER): Payer: Medicare Other | Admitting: Family Medicine

## 2013-07-08 ENCOUNTER — Encounter: Payer: Self-pay | Admitting: Family Medicine

## 2013-07-08 VITALS — BP 154/79 | HR 68 | Temp 99.2°F | Ht 64.0 in | Wt 223.0 lb

## 2013-07-08 DIAGNOSIS — J029 Acute pharyngitis, unspecified: Secondary | ICD-10-CM

## 2013-07-08 NOTE — Progress Notes (Signed)
   Subjective:    Patient ID: Marie [name], female    DOB: 1955-06-09, 58 y.o.   MRN: [id number]  HPI Marie [name] is here for sore throat.   Presents with 3 day history of sore throat. Described as burning. Complaining of odynophagia with solids and liquids. Drinking cold water helps her throat. Reports subjective fevers. Has taken advil but no alleviation. Pain has stayed the same. Her son was recently diagnosed with strep throat. She's had cough and itchy eyes. She denies any rhinorrhea, post nasal drip, nausea or vomiting or ear pain. She's had a tonsillectomy when she was 58 years old.    Current Outpatient Prescriptions on File Prior to Visit  Medication Sig Dispense Refill  . atenolol (TENORMIN) 50 MG tablet Take 1 tablet (50 mg total) by mouth 2 (two) times daily.  60 tablet  5  . buPROPion (WELLBUTRIN XL) 150 MG 24 hr tablet Take 1 tablet (150 mg total) by mouth daily. Take 1 tablet every morning for 7 days then 2 tablets every morning for 20 days  47 tablet  0  . Omega-3 Fatty Acids (FISH OIL PO) Take 1 capsule by mouth daily.      Marland Kitchen omeprazole (PRILOSEC) 20 MG capsule Take 20 mg by mouth daily.      Marland Kitchen topiramate (TOPAMAX) 100 MG tablet Take 1 tablet (100 mg total) by mouth 2 (two) times daily.  60 tablet  3  . venlafaxine XR (EFFEXOR-XR) 75 MG 24 hr capsule Take 225 mg by mouth daily with breakfast.      . fenofibrate (TRICOR) 145 MG tablet Take 1 tablet (145 mg total) by mouth daily.  30 tablet  1   No current facility-administered medications on file prior to visit.    Review of Systems See HPI    Objective:   Physical Exam BP 154/79  Pulse 68  Temp(Src) 99.2 F (37.3 C) (Oral)  Ht 5\' 4"  (1.626 m)  Wt 223 lb (101.152 kg)  BMI 38.26 kg/m2 Gen: NAD, alert, cooperative with exam, well-appearing HEENT: NCAT, PERRL, clear conjunctiva, oropharynx clear, supple neck, no LAD, tympanic membranes intact b/l, no exudates in throat      Assessment & Plan:

## 2013-07-08 NOTE — Patient Instructions (Signed)
Thank you for coming in,   I think that a virus is causing your sore throat. You can try lozenges, chloraseptic spray, or gargling with salt water.   If your swallowing gets worse or you become hoarse then please return to clinic. If you develop a fever, please return to clinic.    Please feel free to call with any questions or concerns at any time, at [phone]. --Dr. Raeford Razor

## 2013-07-08 NOTE — Assessment & Plan Note (Signed)
Most likely viral in origin. No suggestion of strep. No recent weight loss, hoarseness or uvula deviation  - lozenges, chloraseptic spray, or gargling with salt water  - given return precautions

## 2013-11-05 ENCOUNTER — Ambulatory Visit: Payer: Medicare Other | Admitting: Family Medicine

## 2013-11-06 ENCOUNTER — Ambulatory Visit (INDEPENDENT_AMBULATORY_CARE_PROVIDER_SITE_OTHER): Payer: Medicare Other | Admitting: Family Medicine

## 2013-11-06 ENCOUNTER — Ambulatory Visit (INDEPENDENT_AMBULATORY_CARE_PROVIDER_SITE_OTHER): Payer: Medicare Other | Admitting: *Deleted

## 2013-11-06 ENCOUNTER — Encounter: Payer: Self-pay | Admitting: Family Medicine

## 2013-11-06 VITALS — BP 123/82 | HR 73 | Temp 98.0°F | Wt 224.0 lb

## 2013-11-06 DIAGNOSIS — R51 Headache: Secondary | ICD-10-CM

## 2013-11-06 DIAGNOSIS — Z23 Encounter for immunization: Secondary | ICD-10-CM

## 2013-11-06 DIAGNOSIS — R519 Headache, unspecified: Secondary | ICD-10-CM

## 2013-11-06 NOTE — Progress Notes (Signed)
Patient ID: MEAGEN LIMONES, female   DOB: 1955-04-16, 58 y.o.   MRN: [id number] Subjective:   CC: Dental pain  HPI:   Patient presents with dental pain on the right lower jaw/gumline. She reports that pain has been present >1 week. Feels like abscess. Pain is so severe it prevents chewing. Bottom right of jaw. Was taking tylenol and advil for pain which helped a little bit but not enough. Couldn't sit through church service due to pain. Having to eat food on left side of mouth. Last week, felt warm at right jaw and seemed more swollen. Bled inside her mouth with no obvious pus at that time. She does not recall biting her mouth. Denies chills or fevers. Has not noticed any bumps. This has not occurred in the past. Swelling and pain improved some this week after bleeding. No other similar locations. Not hard to move neck. No falls/trauma. No dentist but brushes and flosses twice daily. No tooth pain.   Review of Systems - Per HPI.   PMH - PTSD, tobacco abuse, HLD, HTN, migraines, incontinence  Smoking status: Current smoker    Objective:  Physical Exam BP 123/82 mmHg  Pulse 73  Temp(Src) 98 F (36.7 C) (Oral)  Wt 224 lb (101.606 kg) GEN: NAD HEENT: AT/[place], sclera clear, o/p clear, mild right sided lower jaw fullness, no tenderness on tapping of any teeth, no abscess, erythema, purulence, or other abnormality noted inside mouth. PULM: Normal effort     Assessment:     CRESSIDA MILFORD is a 58 y.o. female here for right lower jaw/face pain.    Plan:     # See problem list and after visit summary for problem-specific plans.   # Health Maintenance: flu shot today  Follow-up: Follow up as needed if symptoms do not improve.   Hilton Sinclair, MD [place]

## 2013-11-06 NOTE — Patient Instructions (Signed)
This looks like a prior abscess that is now resolving. Use warm compresses to the right side of your face 2-3 times daily. Continue salt water gargles 2-3 times daily. If you get fevers, chills, or worsening swelling or pain, come back to see [place]. Otherwise, you need to follow up with a dentist.  Best,  Hilton Sinclair, MD

## 2013-11-09 DIAGNOSIS — R51 Headache: Principal | ICD-10-CM

## 2013-11-09 DIAGNOSIS — R519 Headache, unspecified: Secondary | ICD-10-CM | POA: Insufficient documentation

## 2013-11-09 NOTE — Assessment & Plan Note (Addendum)
Likely abscess right lower gumline that as drained and is now resolving with residual inflammation. No signs of inflammation or infection on exam. Only mild fullness noted right lower jaw and mild tenderness on exam. No fevers/chills. - Warm compress to right lower face and continue salt water gargle 2-3 times daily. - Continue tylenol and ibuprofen PRN pain - reviewed max doses for both and warning signs of gastritis/ulcer/bleeding. - Return precautions reviewed. - f/u with dentist. - Examined and discussed with Dr Erin Hearing.

## 2013-12-19 ENCOUNTER — Other Ambulatory Visit: Payer: Self-pay | Admitting: Family Medicine

## 2013-12-19 DIAGNOSIS — F411 Generalized anxiety disorder: Secondary | ICD-10-CM

## 2013-12-19 MED ORDER — VENLAFAXINE HCL ER 75 MG PO CP24: 225.0000 mg | ORAL_CAPSULE | Freq: Every day | ORAL | Status: DC

## 2013-12-19 NOTE — Telephone Encounter (Signed)
Refill of venlafaxine sent to [facility] listed on her chart. I do not see a pharmacy called [facility] and do not know what she is referring to.

## 2013-12-19 NOTE — Telephone Encounter (Signed)
Needs benlafaxine. Has been out for 3 days  [place]

## 2014-01-19 ENCOUNTER — Ambulatory Visit (INDEPENDENT_AMBULATORY_CARE_PROVIDER_SITE_OTHER): Payer: Medicare Other | Admitting: Family Medicine

## 2014-01-19 ENCOUNTER — Encounter: Payer: Self-pay | Admitting: Family Medicine

## 2014-01-19 VITALS — BP 135/85 | HR 76 | Temp 99.0°F | Ht 64.0 in | Wt 225.3 lb

## 2014-01-19 DIAGNOSIS — G43009 Migraine without aura, not intractable, without status migrainosus: Secondary | ICD-10-CM | POA: Diagnosis not present

## 2014-01-19 DIAGNOSIS — I10 Essential (primary) hypertension: Secondary | ICD-10-CM | POA: Diagnosis not present

## 2014-01-19 DIAGNOSIS — F411 Generalized anxiety disorder: Secondary | ICD-10-CM

## 2014-01-19 DIAGNOSIS — B353 Tinea pedis: Secondary | ICD-10-CM | POA: Diagnosis not present

## 2014-01-19 DIAGNOSIS — R21 Rash and other nonspecific skin eruption: Secondary | ICD-10-CM | POA: Insufficient documentation

## 2014-01-19 MED ORDER — VENLAFAXINE HCL ER 75 MG PO CP24: 225.0000 mg | ORAL_CAPSULE | Freq: Every day | ORAL | Status: DC

## 2014-01-19 MED ORDER — TERBINAFINE HCL 1 % EX CREA: 1.0000 "application " | TOPICAL_CREAM | Freq: Two times a day (BID) | CUTANEOUS | Status: DC

## 2014-01-19 MED ORDER — ATENOLOL 50 MG PO TABS: 50.0000 mg | ORAL_TABLET | Freq: Two times a day (BID) | ORAL | Status: DC

## 2014-01-19 MED ORDER — TOPIRAMATE 100 MG PO TABS: 100.0000 mg | ORAL_TABLET | Freq: Two times a day (BID) | ORAL | Status: DC

## 2014-01-19 NOTE — Assessment & Plan Note (Signed)
Itching pain and peeling, worst on bilateral heels, has tried gold bond only. Also with strong odor. Likely tinea pedis vs eczema - trial of terbinafine - call if not improving in 1 month and will try steroids - Keep feet clean and dry, open air when possible

## 2014-01-19 NOTE — Progress Notes (Signed)
   Subjective:    Patient ID: Marie [name], female    DOB: 1955/07/05, 59 y.o.   MRN: [id number]  HPI Pt presents for eval of "rotting feet" for the past 1 year. She reports that her feet smell terrible and she is embarrassed to go out because of it. They also peel, itch and hurt, mostly on the heels. She has tried gold bond spray which has not helped. It has gradually gotten worse over the past year. She also has peeling of her palms but this has been present for many years and is not painful.   Review of Systems See HPI    Objective:   Physical Exam  Constitutional: She is oriented to person, place, and time. She appears well-developed and well-nourished. No distress.  HENT:  Head: Normocephalic and atraumatic.  Eyes: Conjunctivae are normal. Right eye exhibits no discharge. Left eye exhibits no discharge. No scleral icterus.  Cardiovascular: Normal rate.   Pulmonary/Chest: Effort normal. No respiratory distress.  Abdominal: She exhibits no distension.  Neurological: She is alert and oriented to person, place, and time.  Skin: Skin is warm and dry. Rash noted. She is not diaphoretic.  Erythema, cracking and peeling of bilateral heels  Psychiatric: She has a normal mood and affect. Her behavior is normal.  Nursing note and vitals reviewed.         Assessment & Plan:

## 2014-01-19 NOTE — Patient Instructions (Signed)
I think that you have a fungal infection causing your foot pain and peeling. I have prescribed a cream that should help this. If it is not getting better please call me and we can try something else.  I have sent your other med refills to [facility].  I recommend you meet with Dr. Valentina Lucks again to continue trying to quit smoking. You can make an appointment with him on your way out.

## 2014-01-22 ENCOUNTER — Ambulatory Visit: Payer: Medicare Other | Admitting: Pharmacist

## 2014-02-03 ENCOUNTER — Ambulatory Visit (INDEPENDENT_AMBULATORY_CARE_PROVIDER_SITE_OTHER): Payer: Medicare Other | Admitting: Pharmacist

## 2014-02-03 ENCOUNTER — Encounter: Payer: Self-pay | Admitting: Pharmacist

## 2014-02-03 VITALS — BP 134/88 | HR 75 | Ht 64.0 in | Wt 218.6 lb

## 2014-02-03 DIAGNOSIS — Z720 Tobacco use: Secondary | ICD-10-CM | POA: Diagnosis not present

## 2014-02-03 NOTE — Assessment & Plan Note (Signed)
Moderate nicotine dependence of 40 years duration in a patient who is excellent candidate for success b/c of her past quitting attempts and readiness to quit.    We discussed options for smoking cessation--patient was interested in trying Chantix. However, pharmacy was called and cost for patient would be >$250 even with insurance. Nicotine patch also not covered by insurance, and patient tried bupropion in the past without success. Patient will shop around for OTC nicotine 21mg  patches which should be cheaper than Rx patches. F/u phone call next week on 02/10/14 to f/u nicotine patch purchase. Patient counseled on purpose, proper use, and potential adverse effects, including skin irritation at patch application site and insomnia. Patient has set official quit date for 02/15/14.

## 2014-02-03 NOTE — Patient Instructions (Addendum)
It was nice to see you in clinic today.  - Apply 1 nicotine 21mg  patch daily to dry, hairless skin - Rotate site of patch application daily - Official quit date=February 14 - Please schedule an appointment with Dr. Valentina Lucks in pharmacy in 3-4 weeks

## 2014-02-03 NOTE — Progress Notes (Signed)
S:  Patient arrives alone and in good spirits Patient arrives for evaluation/assistance with tobacco dependence and is motivated to quit. She has quit multiple times in the past without any aid. Most recently, she tried bupropion in May of 2014, which she says did not work for her and made her feel loopy.  Age when started using tobacco on a daily basis: age 59. Number of Cigarettes per day: 20. Brand smoked: [place]. Estimated nicotine content per cigarette: 1mg .  Estimated nicotine intake per day 20mg .   Smokes first cigarette immediately after waking. Denies waking to smoke, but is awake frequently and smokes 2-3 cigarettes during the night.  Most recent quit attempt: May 2015 Longest time ever been tobacco free: 6 years. What medications (NRT, bupropion, varenicline) used in past includes: bupropion--made her loopy.  Rates IMPORTANCE of quitting tobacco on 1-10 scale of 10.  Rates READINESS of quitting tobacco on 1-10 scale of 10. Rates CONFIDENCE of quitting tobacco on 1-10 scale of 5. Triggers to use tobacco include: after meals, inactivity, driving, life altering events.  Motivation to quit includes: cost of cigarettes, helping her mom to quit smoking too.   A/P: Moderate nicotine dependence of 40 years duration in a patient who is excellent candidate for success b/c of her past quitting attempts and readiness to quit.    We discussed options for smoking cessation--patient was interested in trying Chantix. However, pharmacy was called and cost for patient would be >$250 even with insurance. Nicotine patch also not covered by insurance, and patient tried bupropion in the past without success. Patient will shop around for OTC nicotine 21mg  patches which should be cheaper than Rx patches. F/u phone call next week on 02/10/14 to f/u nicotine patch purchase. Patient counseled on purpose, proper use, and potential adverse effects, including skin irritation at patch application site and insomnia.  Patient has set official quit date for 02/15/14.   Written information and additional handouts on smoking cessation were provided. Total time in face-to-face counseling 90 minutes.  Patient seen with Fuller Canada, PharmD resident.

## 2014-02-04 NOTE — Progress Notes (Signed)
Patient ID: Marie [name], female   DOB: 07-20-1955, 59 y.o.   MRN: [id number] Reviewed: Agree with Dr. Graylin Shiver documentation and management.

## 2014-02-26 ENCOUNTER — Ambulatory Visit (INDEPENDENT_AMBULATORY_CARE_PROVIDER_SITE_OTHER): Payer: Medicare Other | Admitting: Family Medicine

## 2014-02-26 ENCOUNTER — Encounter: Payer: Self-pay | Admitting: Family Medicine

## 2014-02-26 VITALS — BP 134/80 | HR 73 | Temp 98.6°F | Ht 64.0 in | Wt 226.2 lb

## 2014-02-26 DIAGNOSIS — M545 Low back pain, unspecified: Secondary | ICD-10-CM

## 2014-02-26 DIAGNOSIS — R21 Rash and other nonspecific skin eruption: Secondary | ICD-10-CM

## 2014-02-26 DIAGNOSIS — M549 Dorsalgia, unspecified: Secondary | ICD-10-CM | POA: Insufficient documentation

## 2014-02-26 MED ORDER — DICLOFENAC SODIUM 75 MG PO TBEC: 75.0000 mg | DELAYED_RELEASE_TABLET | Freq: Two times a day (BID) | ORAL | Status: DC | PRN

## 2014-02-26 MED ORDER — CLOBETASOL PROPIONATE 0.05 % EX OINT: 1.0000 "application " | TOPICAL_OINTMENT | Freq: Two times a day (BID) | CUTANEOUS | Status: DC

## 2014-02-26 NOTE — Progress Notes (Signed)
   Subjective:    Patient ID: Marie [name], female    DOB: [birth year], 59 y.o.   MRN: [id number]  HPI Pt presents for f/u of foot rash. Reports it is unchanged. No improvement with topical lamisil.  Also complains of low back pain. Chronic for >30 years but worsening recently. Started when she hyperextended it will water skiing at age 41, was told at the time it was broken but later a specialist disagreed.   BACK PAIN  Location: lumbar Quality: dull ache Onset: years Worse with: standing Better with: ibuprofen Radiation: none Trauma: remote Best sitting/standing/leaning forward: sitting  Red Flags Fecal/urinary incontinence: no  Numbness/Weakness: no  Fever/chills/sweats: no  Night pain: no  Unexplained weight loss: no  No relief with bedrest: no  h/o cancer/immunosuppression: no  IV drug use: no  PMH of osteoporosis or chronic steroid use: no    Review of Systems See HPI    Objective:   Physical Exam  Constitutional: She is oriented to person, place, and time. She appears well-developed and well-nourished. No distress.  HENT:  Head: Normocephalic and atraumatic.  Eyes: Conjunctivae are normal. Right eye exhibits no discharge. Left eye exhibits no discharge. No scleral icterus.  Cardiovascular: Normal rate.   Pulmonary/Chest: Effort normal. No respiratory distress.  Abdominal: She exhibits no distension.  Musculoskeletal:       Lumbar back: She exhibits tenderness, bony tenderness and pain. She exhibits normal range of motion, no swelling, no edema, no deformity, no laceration, no spasm and normal pulse.  Neurological: She is alert and oriented to person, place, and time.  Skin: Skin is warm and dry. Rash noted. She is not diaphoretic.  Dry flaking redness on bilateral feet, worst on heels  Psychiatric: She has a normal mood and affect. Her behavior is normal.  Nursing note and vitals reviewed.         Assessment & Plan:

## 2014-02-26 NOTE — Patient Instructions (Signed)
For your foot rash, I would like to switch to a steroid cream to see if this helps you more.  For your back pain I want to get an xray. I am also prescribing and anti-inflammatory medication to help with the pain.  Please come back and see me in 3 months or sooner if you are having any problems.

## 2014-02-26 NOTE — Assessment & Plan Note (Signed)
Itching/burning rash on heels, no improvement with topical lamisil x1 month. Dishydrotic eczema vs tinea  - switch to clobetasol  - if not improving on this, may need to move to systemic antifungal

## 2014-02-26 NOTE — Assessment & Plan Note (Signed)
Acute on chronic LBP with midline tenderness and remote h/o trauma - lumbar xray - normal labs last year - trial of diclofenac for likely OA - f/u in 3 months

## 2014-02-27 ENCOUNTER — Ambulatory Visit
Admission: RE | Admit: 2014-02-27 | Discharge: 2014-02-27 | Disposition: A | Discharge status: Home / Self Care | Payer: Medicare Other | Source: Ambulatory Visit | Attending: Family Medicine | Admitting: Family Medicine

## 2014-02-27 ENCOUNTER — Encounter: Payer: Self-pay | Admitting: Family Medicine

## 2014-02-27 ENCOUNTER — Other Ambulatory Visit: Payer: Self-pay | Admitting: Family Medicine

## 2014-02-27 DIAGNOSIS — M545 Low back pain, unspecified: Secondary | ICD-10-CM

## 2014-02-27 DIAGNOSIS — M4186 Other forms of scoliosis, lumbar region: Secondary | ICD-10-CM | POA: Diagnosis not present

## 2014-02-27 DIAGNOSIS — M5136 Other intervertebral disc degeneration, lumbar region: Secondary | ICD-10-CM | POA: Diagnosis not present

## 2014-02-27 DIAGNOSIS — M47816 Spondylosis without myelopathy or radiculopathy, lumbar region: Secondary | ICD-10-CM | POA: Diagnosis not present

## 2014-02-27 MED ORDER — TRIAMCINOLONE ACETONIDE 0.5 % EX OINT: 1.0000 "application " | TOPICAL_OINTMENT | Freq: Two times a day (BID) | CUTANEOUS | Status: DC

## 2014-02-27 NOTE — Progress Notes (Signed)
Pt. Came in and said that Dr. Sherril Cong prescribed a Rx (02/26/2014) that was a tier 3 RX and costs over $300. The Rx is clobetasol and she needs a tier 1 rx because she can not afford it. Please send to same pharmacy that previous rx was sent to. Call pt @ [phone] / thanks sr

## 2014-02-27 NOTE — Progress Notes (Signed)
I sent a script for triamcinolone. Please call and let her know. I have no way of knowing what meds are what tier for her but if this is still expensive please ask her to call her insurance and ask them what topical steroids are tier 1 and I will be happy to prescribe one of those.  Thanks!

## 2014-03-04 NOTE — Progress Notes (Signed)
Patient informed, expressed understanding. 

## 2014-03-19 ENCOUNTER — Encounter: Payer: Self-pay | Admitting: Internal Medicine

## 2014-04-27 ENCOUNTER — Ambulatory Visit (AMBULATORY_SURGERY_CENTER): Payer: Self-pay | Admitting: *Deleted

## 2014-04-27 VITALS — Ht 64.0 in | Wt 224.0 lb

## 2014-04-27 DIAGNOSIS — Z800 Family history of malignant neoplasm of digestive organs: Secondary | ICD-10-CM

## 2014-04-27 NOTE — Progress Notes (Signed)
Patient denies any allergies to eggs or soy. Patient denies any problems with anesthesia/sedation. Patient denies any oxygen use at home and does not take any diet/weight loss medications. EMMI education assisgned to patient on colonoscopy, this was explained and instructions given to patient. 

## 2014-05-07 ENCOUNTER — Encounter: Payer: Self-pay | Admitting: Internal Medicine

## 2014-05-19 ENCOUNTER — Telehealth: Payer: Self-pay | Admitting: Gastroenterology

## 2014-05-19 NOTE — Telephone Encounter (Signed)
Pt has watery diarrhea today. No abd pain, N/V, fever, bleeding. Clear liquid diet thru tomorrow morning. Imodium tid as needed. Call office if symptoms not improved.

## 2014-05-20 ENCOUNTER — Encounter: Payer: Self-pay | Admitting: Internal Medicine

## 2014-05-20 ENCOUNTER — Ambulatory Visit (AMBULATORY_SURGERY_CENTER): Payer: Medicare Other | Admitting: Internal Medicine

## 2014-05-20 VITALS — BP 128/79 | HR 80 | Temp 97.8°F | Resp 18 | Ht 64.0 in | Wt 224.0 lb

## 2014-05-20 DIAGNOSIS — F341 Dysthymic disorder: Secondary | ICD-10-CM | POA: Diagnosis not present

## 2014-05-20 DIAGNOSIS — Z800 Family history of malignant neoplasm of digestive organs: Secondary | ICD-10-CM | POA: Diagnosis present

## 2014-05-20 DIAGNOSIS — Z1211 Encounter for screening for malignant neoplasm of colon: Secondary | ICD-10-CM

## 2014-05-20 MED ORDER — SODIUM CHLORIDE 0.9 % IV SOLN: 500.0000 mL | INTRAVENOUS | Status: DC

## 2014-05-20 NOTE — Progress Notes (Signed)
  [facility] Post-op Note  Patient: Marie [name]  Procedure(s) Performed: colonoscopy  Patient Location: [facility] - Recovery Area  Anesthesia Type: Deep Sedation/Propofol  Level of Consciousness: awake, oriented and patient cooperative  Airway and Oxygen Therapy: Patient Spontanous Breathing  Post-op Pain: none  Post-op Assessment:  Post-op Vital signs reviewed, Patient's Cardiovascular Status Stable, Respiratory Function Stable, Patent Airway, No signs of Nausea or vomiting and Pain level controlled  Post-op Vital Signs: Reviewed and stable  Complications: No apparent anesthesia complications  [name] E 12:41 PM

## 2014-05-20 NOTE — Op Note (Addendum)
[place]  [address] [place], [address]   COLONOSCOPY PROCEDURE REPORT  PATIENT: Marie [name], Marie [name]  MR#: [id number] BIRTHDATE: May 22, 1955 , 86  yrs. old GENDER: female ENDOSCOPIST: Lafayette Dragon, MD REFERRED BY:Dr Beverlyn Roux PROCEDURE DATE:  05/20/2014 PROCEDURE:   Colonoscopy, screening First Screening Colonoscopy - Avg.  risk and is 50 yrs.  old or older - No.  Prior Negative Screening - Now for repeat screening. N/A  History of Adenoma - Now for follow-up colonoscopy & has been > or = to 3 yrs.  N/A  Polyps removed today? No Recommend repeat exam, <10 yrs? No ASA CLASS:   Class II INDICATIONS:Screening for colonic neoplasia, FH Colon or Rectal Adenocarcinoma, and last colonoscopy April 2008.  Hyperplastic polyp -2 removed.  History of anal fissure.  Positive family history of colon cancer in a grandparent. and parent MEDICATIONS: Monitored anesthesia care and Propofol 250 mg IV  DESCRIPTION OF PROCEDURE:   After the risks benefits and alternatives of the procedure were thoroughly explained, informed consent was obtained.  The digital rectal exam revealed no abnormalities of the rectum.   The LB PCF Q180 [id number]  endoscope was introduced through the anus and advanced to the cecum, which was identified by both the appendix and ileocecal valve. No adverse events experienced.   The quality of the prep was good.  (MoviPrep was used)  The instrument was then slowly withdrawn as the colon was fully examined. Estimated blood loss is zero unless otherwise noted in this procedure report.      COLON FINDINGS: There was moderate diverticulosis noted in the left colon with associated muscular hypertrophy, tortuosity and angulation.  Retroflexed views revealed no abnormalities. The time to cecum = 6.03 Withdrawal time = 8.15   The scope was withdrawn and the procedure completed. COMPLICATIONS: There were no immediate complications.  ENDOSCOPIC  IMPRESSION: There was moderate diverticulosis noted in the left colon  RECOMMENDATIONS: High fiber diet Recall colonoscopy in 5 years Fiber supplements such as Benefiber 1 teaspoon daily  eSigned:  Lafayette Dragon, MD 05/20/2014 4:34 PM Revised: 05/20/2014 4:34 PM  cc:   PATIENT NAME:  Nyara, Capell MR#: [id number]

## 2014-05-20 NOTE — Patient Instructions (Signed)
YOU HAD AN ENDOSCOPIC PROCEDURE TODAY AT [facility]:   Refer to the procedure report that was given to you for any specific questions about what was found during the examination.  If the procedure report does not answer your questions, please call your gastroenterologist to clarify.  If you requested that your care partner not be given the details of your procedure findings, then the procedure report has been included in a sealed envelope for you to review at your convenience later.  YOU SHOULD EXPECT: Some feelings of bloating in the abdomen. Passage of more gas than usual.  Walking can help get rid of the air that was put into your GI tract during the procedure and reduce the bloating. If you had a lower endoscopy (such as a colonoscopy or flexible sigmoidoscopy) you may notice spotting of blood in your stool or on the toilet paper. If you underwent a bowel prep for your procedure, you may not have a normal bowel movement for a few days.  Please Note:  You might notice some irritation and congestion in your nose or some drainage.  This is from the oxygen used during your procedure.  There is no need for concern and it should clear up in a day or so.  SYMPTOMS TO REPORT IMMEDIATELY:   Following lower endoscopy (colonoscopy or flexible sigmoidoscopy):  Excessive amounts of blood in the stool  Significant tenderness or worsening of abdominal pains  Swelling of the abdomen that is new, acute  Fever of 100F or higher    For urgent or emergent issues, a gastroenterologist can be reached at any hour by calling [phone].   DIET: Your first meal following the procedure should be a small meal and then it is ok to progress to your normal diet. Heavy or fried foods are harder to digest and may make you feel nauseous or bloated.  Likewise, meals heavy in dairy and vegetables can increase bloating.  Drink plenty of fluids but you should avoid alcoholic beverages for 24  hours.  ACTIVITY:  You should plan to take it easy for the rest of today and you should NOT DRIVE or use heavy machinery until tomorrow (because of the sedation medicines used during the test).    Diverticulosis and high fiber information given.  One teaspoon Benefiber daily.  FOLLOW UP: Our staff will call the number listed on your records the next business day following your procedure to check on you and address any questions or concerns that you may have regarding the information given to you following your procedure. If we do not reach you, we will leave a message.  However, if you are feeling well and you are not experiencing any problems, there is no need to return our call.  We will assume that you have returned to your regular daily activities without incident.  If any biopsies were taken you will be contacted by phone or by letter within the next 1-3 weeks.  Please call us at [phone] if you have not heard about the biopsies in 3 weeks.    SIGNATURES/CONFIDENTIALITY: You and/or your care partner have signed paperwork which will be entered into your electronic medical record.  These signatures attest to the fact that that the information above on your After Visit Summary has been reviewed and is understood.  Full responsibility of the confidentiality of this discharge information lies with you and/or your care-partner.

## 2014-05-21 ENCOUNTER — Telehealth: Payer: Self-pay | Admitting: *Deleted

## 2014-05-21 NOTE — Telephone Encounter (Signed)
Name identifier, left message, follow-up 

## 2014-08-10 ENCOUNTER — Encounter: Payer: Self-pay | Admitting: Family Medicine

## 2014-08-10 ENCOUNTER — Ambulatory Visit (INDEPENDENT_AMBULATORY_CARE_PROVIDER_SITE_OTHER): Payer: Medicare Other | Admitting: Family Medicine

## 2014-08-10 VITALS — BP 140/79 | HR 70 | Temp 98.0°F | Ht 64.0 in | Wt 226.0 lb

## 2014-08-10 DIAGNOSIS — R739 Hyperglycemia, unspecified: Secondary | ICD-10-CM | POA: Diagnosis not present

## 2014-08-10 DIAGNOSIS — I10 Essential (primary) hypertension: Secondary | ICD-10-CM

## 2014-08-10 DIAGNOSIS — F411 Generalized anxiety disorder: Secondary | ICD-10-CM

## 2014-08-10 DIAGNOSIS — E119 Type 2 diabetes mellitus without complications: Secondary | ICD-10-CM | POA: Insufficient documentation

## 2014-08-10 DIAGNOSIS — G43009 Migraine without aura, not intractable, without status migrainosus: Secondary | ICD-10-CM

## 2014-08-10 LAB — POCT GLYCOSYLATED HEMOGLOBIN (HGB A1C): Hemoglobin A1C: 7.3

## 2014-08-10 MED ORDER — VENLAFAXINE HCL ER 75 MG PO CP24: 225.0000 mg | ORAL_CAPSULE | Freq: Every day | ORAL | Status: DC

## 2014-08-10 MED ORDER — METFORMIN HCL 500 MG PO TABS: 500.0000 mg | ORAL_TABLET | Freq: Two times a day (BID) | ORAL | Status: DC

## 2014-08-10 MED ORDER — ONETOUCH ULTRA 2 W/DEVICE KIT: 1.0000 | PACK | Freq: Every day | Status: AC

## 2014-08-10 MED ORDER — ONETOUCH ULTRASOFT LANCETS MISC: Status: DC

## 2014-08-10 MED ORDER — BUTALBITAL-ACETAMINOPHEN 50-325 MG PO TABS: 1.0000 | ORAL_TABLET | Freq: Every day | ORAL | Status: DC | PRN

## 2014-08-10 MED ORDER — TOPIRAMATE 100 MG PO TABS: 100.0000 mg | ORAL_TABLET | Freq: Two times a day (BID) | ORAL | Status: DC

## 2014-08-10 MED ORDER — GLUCOSE BLOOD VI STRP: ORAL_STRIP | Status: DC

## 2014-08-10 MED ORDER — ATENOLOL 50 MG PO TABS: 50.0000 mg | ORAL_TABLET | Freq: Two times a day (BID) | ORAL | Status: DC

## 2014-08-10 MED ORDER — BUTALBITAL-ACETAMINOPHEN 25-325 MG PO TABS: 1.0000 | ORAL_TABLET | Freq: Every day | ORAL | Status: DC | PRN

## 2014-08-10 NOTE — Patient Instructions (Signed)
Diabetes Mellitus and Food It is important for you to manage your blood sugar (glucose) level. Your blood glucose level can be greatly affected by what you eat. Eating healthier foods in the appropriate amounts throughout the day at about the same time each day will help you control your blood glucose level. It can also help slow or prevent worsening of your diabetes mellitus. Healthy eating may even help you improve the level of your blood pressure and reach or maintain a healthy weight.  HOW CAN FOOD AFFECT ME? Carbohydrates Carbohydrates affect your blood glucose level more than any other type of food. Your dietitian will help you determine how many carbohydrates to eat at each meal and teach you how to count carbohydrates. Counting carbohydrates is important to keep your blood glucose at a healthy level, especially if you are using insulin or taking certain medicines for diabetes mellitus. Alcohol Alcohol can cause sudden decreases in blood glucose (hypoglycemia), especially if you use insulin or take certain medicines for diabetes mellitus. Hypoglycemia can be a life-threatening condition. Symptoms of hypoglycemia (sleepiness, dizziness, and disorientation) are similar to symptoms of having too much alcohol.  If your health care provider has given you approval to drink alcohol, do so in moderation and use the following guidelines:  Women should not have more than one drink per day, and men should not have more than two drinks per day. One drink is equal to:  12 oz of beer.  5 oz of wine.  1 oz of hard liquor.  Do not drink on an empty stomach.  Keep yourself hydrated. Have water, diet soda, or unsweetened iced tea.  Regular soda, juice, and other mixers might contain a lot of carbohydrates and should be counted. WHAT FOODS ARE NOT RECOMMENDED? As you make food choices, it is important to remember that all foods are not the same. Some foods have fewer nutrients per serving than other  foods, even though they might have the same number of calories or carbohydrates. It is difficult to get your body what it needs when you eat foods with fewer nutrients. Examples of foods that you should avoid that are high in calories and carbohydrates but low in nutrients include:  Trans fats (most processed foods list trans fats on the Nutrition Facts label).  Regular soda.  Juice.  Candy.  Sweets, such as cake, pie, doughnuts, and cookies.  Fried foods. WHAT FOODS CAN I EAT? Have nutrient-rich foods, which will nourish your body and keep you healthy. The food you should eat also will depend on several factors, including:  The calories you need.  The medicines you take.  Your weight.  Your blood glucose level.  Your blood pressure level.  Your cholesterol level. You also should eat a variety of foods, including:  Protein, such as meat, poultry, fish, tofu, nuts, and seeds (lean animal proteins are best).  Fruits.  Vegetables.  Dairy products, such as milk, cheese, and yogurt (low fat is best).  Breads, grains, pasta, cereal, rice, and beans.  Fats such as olive oil, trans fat-free margarine, canola oil, avocado, and olives. DOES EVERYONE WITH DIABETES MELLITUS HAVE THE SAME MEAL PLAN? Because every person with diabetes mellitus is different, there is not one meal plan that works for everyone. It is very important that you meet with a dietitian who will help you create a meal plan that is just right for you. Document Released: 09/15/2004 Document Revised: 12/24/2012 Document Reviewed: 11/15/2012 [employer] Patient Information 2015 [employer]. This   information is not intended to replace advice given to you by your health care provider. Make sure you discuss any questions you have with your health care provider.  

## 2014-08-10 NOTE — Progress Notes (Signed)
   Subjective:   Marie [name] is a 59 y.o. female with a history of HTN, HLD here for hyperglycemia  Pt presents for concern for diabetes. Reports that she has had persistent headaches and profuse sweating which led her to purchase a glucometer. She has been checking her sugars at home with fastings of 140-180 and postprandials in the 200s. He sister has diabetes. She also reports polydipsia and constant cravings for mountain dew, which she is trying to cut down. She has had several occasions when she ate a lot (more than is normal for her) but denies polyuria.  Review of Systems:  Per HPI. All other systems reviewed and are negative.   PMH, PSH, Medications, Allergies, and FmHx reviewed and updated in EMR.  Social History: current smoker  Objective:  BP 140/79 mmHg  Pulse 70  Temp(Src) 98 F (36.7 C) (Oral)  Ht 5\' 4"  (1.626 m)  Wt 226 lb (102.513 kg)  BMI 38.77 kg/m2  Gen:  59 y.o. female in NAD HEENT: NCAT, MMM, EOMI, PERRL, anicteric sclerae CV: RRR, no MRG, no JVD Resp: Non-labored, CTAB, no wheezes noted Abd: Soft, NTND, BS present, no guarding or organomegaly Ext: WWP, no edema MSK: Full ROM, strength intact Neuro: Alert and oriented, speech normal      Chemistry      Component Value Date/Time   NA 141 03/05/2013 1151   K 4.5 03/05/2013 1151   CL 106 03/05/2013 1151   CO2 27 03/05/2013 1151   BUN 8 03/05/2013 1151   CREATININE 0.60 03/05/2013 1151   CREATININE 0.70 02/13/2013 1941      Component Value Date/Time   CALCIUM 9.6 03/05/2013 1151   ALKPHOS 78 03/05/2013 1151   AST 18 03/05/2013 1151   ALT 15 03/05/2013 1151   BILITOT 0.3 03/05/2013 1151      Lab Results  Component Value Date   WBC 6.8 02/13/2013   HGB 17.7* 02/13/2013   HCT 52.0* 02/13/2013   MCV 92.5 02/13/2013   PLT 300 02/13/2013   Lab Results  Component Value Date   TSH 1.615 04/07/2009   Lab Results  Component Value Date   HGBA1C 5.8 03/05/2013   Assessment:     Marie [name] is a 59 y.o. female here for diabetes    Plan:     See problem list for problem-specific plans.   Frazier Richards, MD PGY-3,  [facility] 08/10/2014  2:06 PM

## 2014-08-11 ENCOUNTER — Telehealth: Payer: Self-pay | Admitting: *Deleted

## 2014-08-11 DIAGNOSIS — G43009 Migraine without aura, not intractable, without status migrainosus: Secondary | ICD-10-CM

## 2014-08-11 NOTE — Telephone Encounter (Signed)
Prior Authorization received from [place] for butalbital-acetaminophen.  PA form placed in provider box for completion. Derl Barrow, RN

## 2014-08-13 MED ORDER — GABAPENTIN 100 MG PO CAPS: 100.0000 mg | ORAL_CAPSULE | Freq: Three times a day (TID) | ORAL | Status: DC | PRN

## 2014-08-13 NOTE — Telephone Encounter (Signed)
I have called in an a different med for her migraines that should be covered. Please let her know

## 2014-08-13 NOTE — Telephone Encounter (Signed)
Patient informed, expressed understanding. 

## 2014-08-14 NOTE — Assessment & Plan Note (Signed)
Frequent headaches over the past few weeks, likely related to diabetes/hyperglycemia - will add prn gabapentin as patient wants another med to alternate with her tylenol and ibuprofen - continue topamax, atenolol - if not improving with treatment of diabetes consider referral back to neuro given multi-drug regimen for migraines

## 2014-08-14 NOTE — Assessment & Plan Note (Signed)
Controlled at 140/79 today, tolerating current therapy - continue atenolol - f/u in 3 months

## 2014-08-14 NOTE — Assessment & Plan Note (Addendum)
New diagnosis today, A1c 7.3 and home fasting glucose >150 on multiple occasions - gave testing supplies, cbgs daily (some fasting some postprandial, patient to log them) - start metformin 500mg  bid - f/u in 3 months

## 2014-08-25 IMAGING — CR DG FINGER THUMB 2+V*L*
3 series · 3 of 3 positions shown · non-contrast
Comparison: None.

CLINICAL DATA: Injured thumb 1 week ago with pain and swelling

EXAM:
LEFT THUMB 2+V

[x finger pa left]
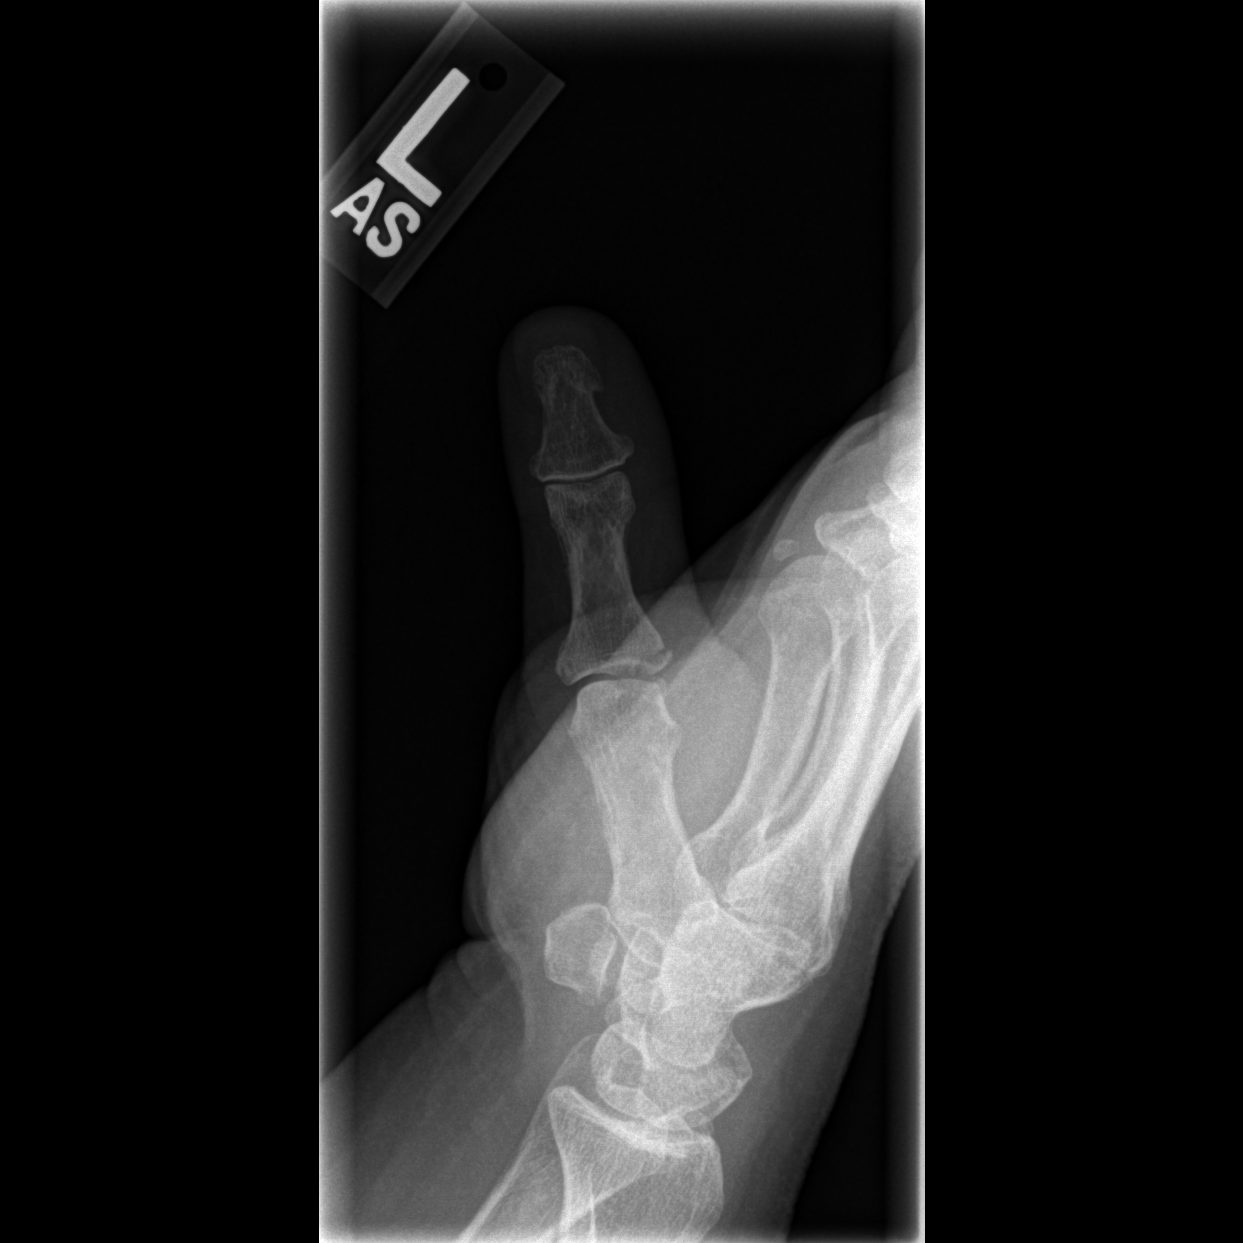

[x finger obl. left]
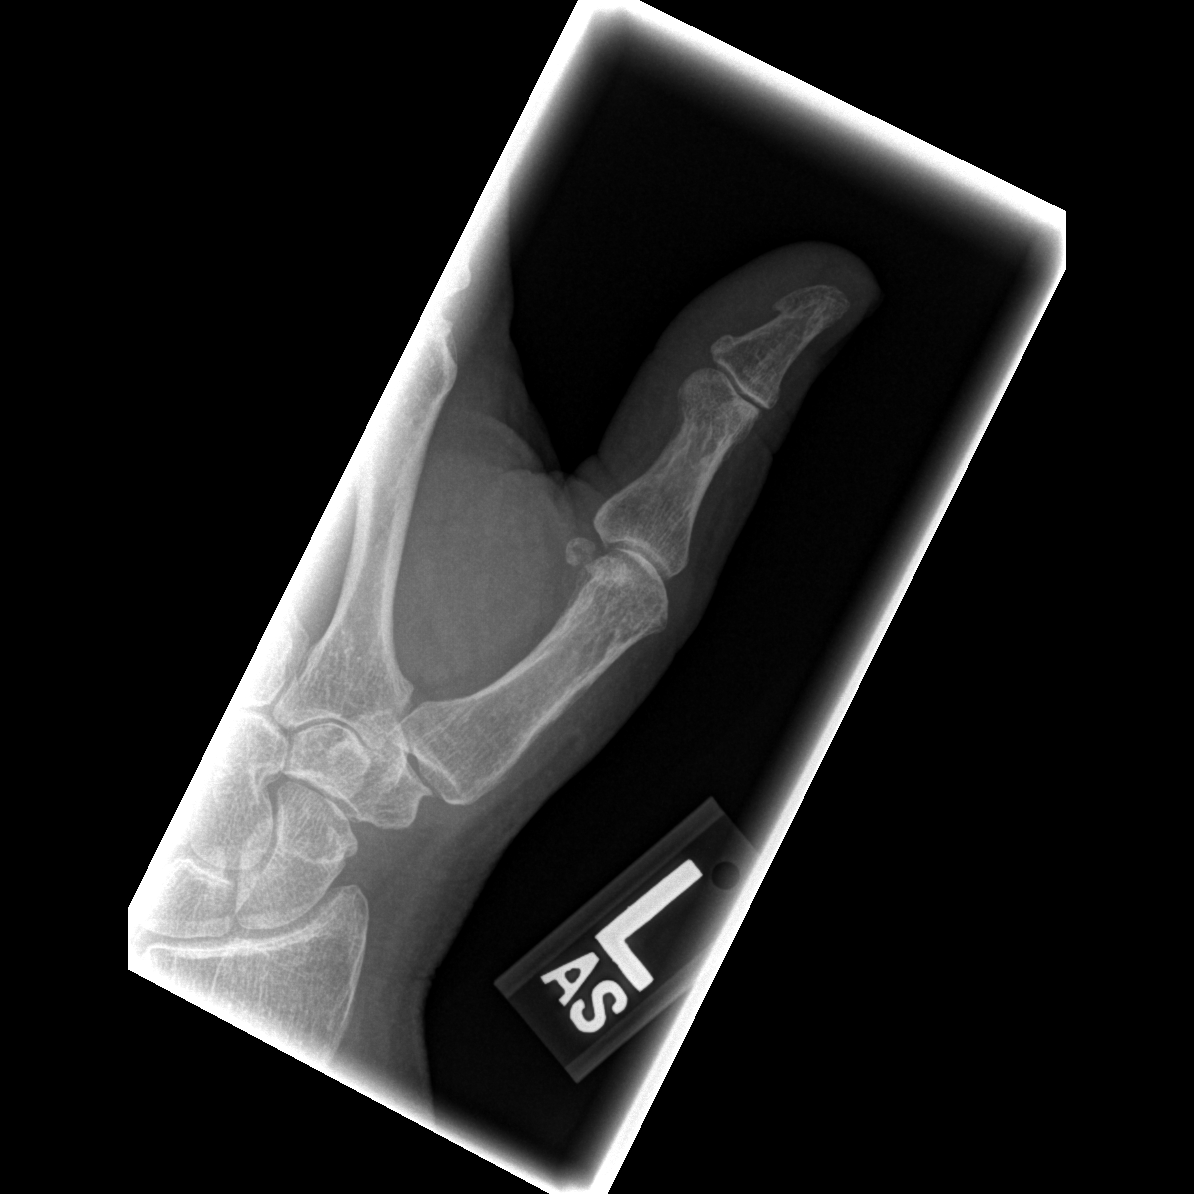

[x finger lateral left]
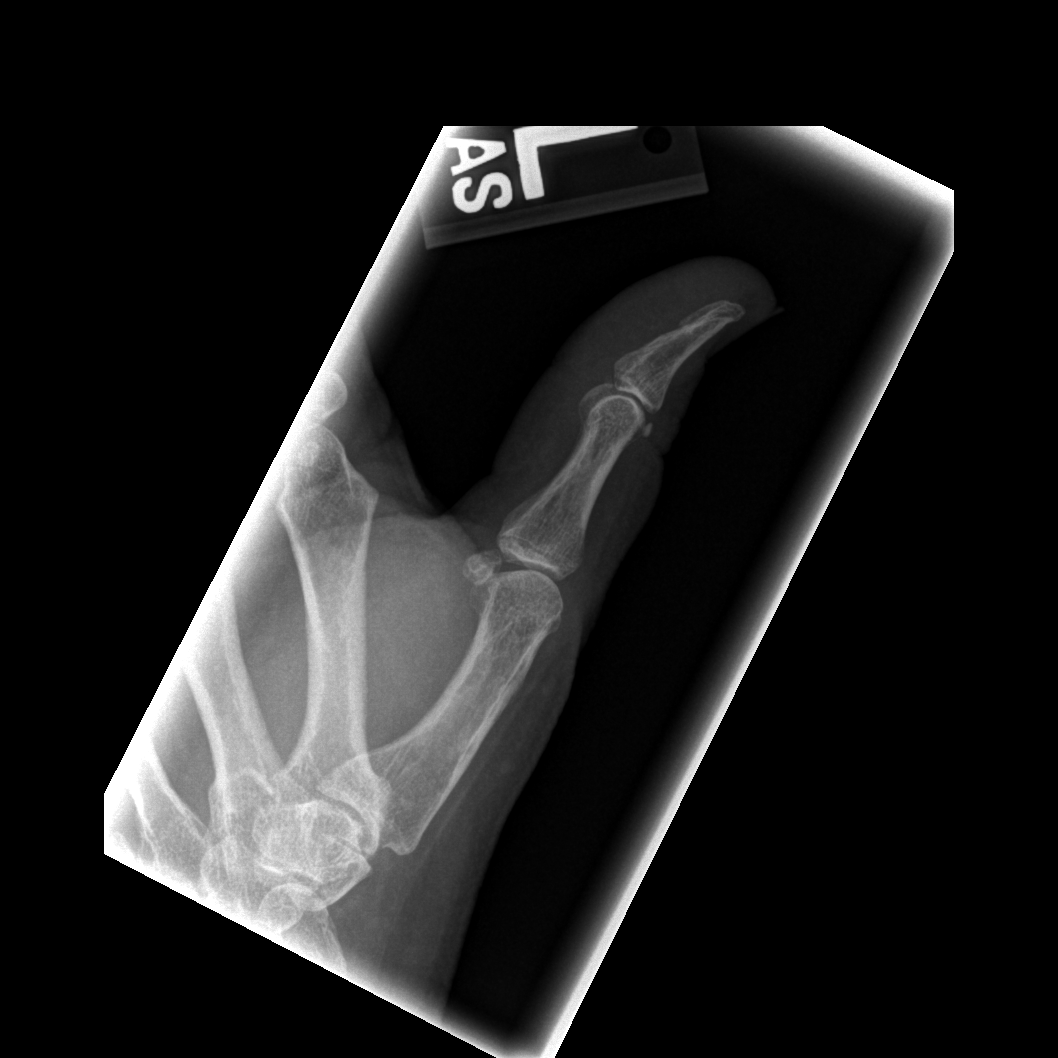

[3 of 3 positions shown; findings below may reference images not displayed]

FINDINGS: There is an avulsion fracture through the base of the proximal
phalanx of the left thumb with only minimal distraction. This
fracture does involve the left first MCP joint. .
IMPRESSION: Fracture through the base of the proximal phalanx of the left thumb
involving the left first MCP joint.

## 2014-09-22 ENCOUNTER — Encounter: Payer: Medicare Other | Attending: Family Medicine

## 2014-09-22 VITALS — Ht 64.0 in | Wt 221.1 lb

## 2014-09-22 DIAGNOSIS — E119 Type 2 diabetes mellitus without complications: Secondary | ICD-10-CM

## 2014-09-22 DIAGNOSIS — Z713 Dietary counseling and surveillance: Secondary | ICD-10-CM | POA: Diagnosis not present

## 2014-09-22 NOTE — Progress Notes (Signed)

## 2014-09-29 DIAGNOSIS — Z713 Dietary counseling and surveillance: Secondary | ICD-10-CM | POA: Diagnosis not present

## 2014-09-29 DIAGNOSIS — E119 Type 2 diabetes mellitus without complications: Secondary | ICD-10-CM | POA: Diagnosis not present

## 2014-09-29 NOTE — Progress Notes (Signed)

## 2014-10-06 ENCOUNTER — Ambulatory Visit: Payer: Self-pay

## 2014-10-15 ENCOUNTER — Emergency Department (HOSPITAL_COMMUNITY): Payer: Medicare Other

## 2014-10-15 ENCOUNTER — Encounter (HOSPITAL_COMMUNITY): Payer: Self-pay | Admitting: Emergency Medicine

## 2014-10-15 ENCOUNTER — Emergency Department (HOSPITAL_COMMUNITY)
Admission: EM | Admit: 2014-10-15 | Discharge: 2014-10-15 | Disposition: A | Discharge status: Home / Self Care | Payer: Medicare Other | Attending: Emergency Medicine | Admitting: Emergency Medicine

## 2014-10-15 DIAGNOSIS — N23 Unspecified renal colic: Secondary | ICD-10-CM | POA: Diagnosis not present

## 2014-10-15 DIAGNOSIS — R109 Unspecified abdominal pain: Secondary | ICD-10-CM | POA: Diagnosis present

## 2014-10-15 DIAGNOSIS — F4310 Post-traumatic stress disorder, unspecified: Secondary | ICD-10-CM | POA: Insufficient documentation

## 2014-10-15 DIAGNOSIS — Z79899 Other long term (current) drug therapy: Secondary | ICD-10-CM | POA: Insufficient documentation

## 2014-10-15 DIAGNOSIS — I10 Essential (primary) hypertension: Secondary | ICD-10-CM | POA: Insufficient documentation

## 2014-10-15 DIAGNOSIS — Z720 Tobacco use: Secondary | ICD-10-CM | POA: Insufficient documentation

## 2014-10-15 DIAGNOSIS — E119 Type 2 diabetes mellitus without complications: Secondary | ICD-10-CM | POA: Insufficient documentation

## 2014-10-15 DIAGNOSIS — F329 Major depressive disorder, single episode, unspecified: Secondary | ICD-10-CM | POA: Insufficient documentation

## 2014-10-15 DIAGNOSIS — Z880 Allergy status to penicillin: Secondary | ICD-10-CM | POA: Insufficient documentation

## 2014-10-15 DIAGNOSIS — N132 Hydronephrosis with renal and ureteral calculous obstruction: Secondary | ICD-10-CM | POA: Diagnosis not present

## 2014-10-15 DIAGNOSIS — J449 Chronic obstructive pulmonary disease, unspecified: Secondary | ICD-10-CM | POA: Insufficient documentation

## 2014-10-15 DIAGNOSIS — G43909 Migraine, unspecified, not intractable, without status migrainosus: Secondary | ICD-10-CM | POA: Insufficient documentation

## 2014-10-15 DIAGNOSIS — K219 Gastro-esophageal reflux disease without esophagitis: Secondary | ICD-10-CM | POA: Diagnosis not present

## 2014-10-15 LAB — CBC WITH DIFFERENTIAL/PLATELET
BASOS PCT: 0 %
Basophils Absolute: 0 10*3/uL (ref 0.0–0.1)
EOS ABS: 0.2 10*3/uL (ref 0.0–0.7)
Eosinophils Relative: 1 %
HEMATOCRIT: 46.6 % — AB (ref 36.0–46.0)
Hemoglobin: 15.6 g/dL — ABNORMAL HIGH (ref 12.0–15.0)
LYMPHS ABS: 3 10*3/uL (ref 0.7–4.0)
Lymphocytes Relative: 19 %
MCH: 32 pg (ref 26.0–34.0)
MCHC: 33.5 g/dL (ref 30.0–36.0)
MCV: 95.7 fL (ref 78.0–100.0)
MONO ABS: 0.9 10*3/uL (ref 0.1–1.0)
MONOS PCT: 6 %
Neutro Abs: 11.6 10*3/uL — ABNORMAL HIGH (ref 1.7–7.7)
Neutrophils Relative %: 74 %
Platelets: 356 10*3/uL (ref 150–400)
RBC: 4.87 MIL/uL (ref 3.87–5.11)
RDW: 13.1 % (ref 11.5–15.5)
WBC: 15.8 10*3/uL — ABNORMAL HIGH (ref 4.0–10.5)

## 2014-10-15 LAB — COMPREHENSIVE METABOLIC PANEL
ALT: 25 U/L (ref 14–54)
ANION GAP: 11 (ref 5–15)
AST: 31 U/L (ref 15–41)
Albumin: 4.6 g/dL (ref 3.5–5.0)
Alkaline Phosphatase: 87 U/L (ref 38–126)
BILIRUBIN TOTAL: 0.4 mg/dL (ref 0.3–1.2)
BUN: 15 mg/dL (ref 6–20)
CALCIUM: 10.5 mg/dL — AB (ref 8.9–10.3)
CO2: 23 mmol/L (ref 22–32)
Chloride: 105 mmol/L (ref 101–111)
Creatinine, Ser: 0.76 mg/dL (ref 0.44–1.00)
GFR calc non Af Amer: >60 mL/min (ref >60)
GLUCOSE: 162 mg/dL — AB (ref 65–99)
POTASSIUM: 3.9 mmol/L (ref 3.5–5.1)
Sodium: 139 mmol/L (ref 135–145)
TOTAL PROTEIN: 8.5 g/dL — AB (ref 6.5–8.1)

## 2014-10-15 LAB — URINALYSIS, ROUTINE W REFLEX MICROSCOPIC
BILIRUBIN URINE: NEGATIVE
Glucose, UA: NEGATIVE mg/dL
KETONES UR: NEGATIVE mg/dL
NITRITE: NEGATIVE
PH: 5.5 (ref 5.0–8.0)
PROTEIN: 30 mg/dL — AB
Specific Gravity, Urine: 1.023 (ref 1.005–1.030)
UROBILINOGEN UA: 0.2 mg/dL (ref 0.0–1.0)

## 2014-10-15 LAB — URINE MICROSCOPIC-ADD ON

## 2014-10-15 MED ORDER — ONDANSETRON HCL 4 MG/2ML IJ SOLN
4.0000 mg | Freq: Once | INTRAMUSCULAR | Status: AC
  Administered 2014-10-15: 4 mg via INTRAVENOUS
  Filled 2014-10-15: qty 2

## 2014-10-15 MED ORDER — HYDROMORPHONE HCL 1 MG/ML IJ SOLN
1.0000 mg | Freq: Once | INTRAMUSCULAR | Status: AC
  Administered 2014-10-15: 1 mg via INTRAVENOUS
  Filled 2014-10-15: qty 1

## 2014-10-15 MED ORDER — TAMSULOSIN HCL 0.4 MG PO CAPS: 0.4000 mg | ORAL_CAPSULE | Freq: Every day | ORAL | Status: DC

## 2014-10-15 MED ORDER — MORPHINE SULFATE (PF) 4 MG/ML IV SOLN
4.0000 mg | Freq: Once | INTRAVENOUS | Status: AC
Start: 2014-10-15 — End: 2014-10-15
  Administered 2014-10-15: 4 mg via INTRAVENOUS
  Filled 2014-10-15: qty 1

## 2014-10-15 MED ORDER — KETOROLAC TROMETHAMINE 30 MG/ML IJ SOLN
30.0000 mg | Freq: Once | INTRAMUSCULAR | Status: AC
  Administered 2014-10-15: 30 mg via INTRAVENOUS
  Filled 2014-10-15: qty 1

## 2014-10-15 MED ORDER — OXYCODONE-ACETAMINOPHEN 5-325 MG PO TABS: 1.0000 | ORAL_TABLET | ORAL | Status: DC | PRN

## 2014-10-15 NOTE — Discharge Instructions (Signed)
Kidney Stones °Kidney stones (urolithiasis) are deposits that form inside your kidneys. The intense pain is caused by the stone moving through the urinary tract. When the stone moves, the ureter goes into spasm around the stone. The stone is usually passed in the urine.  °CAUSES  °· A disorder that makes certain neck glands produce too much parathyroid hormone (primary hyperparathyroidism). °· A buildup of uric acid crystals, similar to gout in your joints. °· Narrowing (stricture) of the ureter. °· A kidney obstruction present at birth (congenital obstruction). °· Previous surgery on the kidney or ureters. °· Numerous kidney infections. °SYMPTOMS  °· Feeling sick to your stomach (nauseous). °· Throwing up (vomiting). °· Blood in the urine (hematuria). °· Pain that usually spreads (radiates) to the groin. °· Frequency or urgency of urination. °DIAGNOSIS  °· Taking a history and physical exam. °· Blood or urine tests. °· CT scan. °· Occasionally, an examination of the inside of the urinary bladder (cystoscopy) is performed. °TREATMENT  °· Observation. °· Increasing your fluid intake. °· Extracorporeal shock wave lithotripsy--This is a noninvasive procedure that uses shock waves to break up kidney stones. °· Surgery may be needed if you have severe pain or persistent obstruction. There are various surgical procedures. Most of the procedures are performed with the use of small instruments. Only small incisions are needed to accommodate these instruments, so recovery time is minimized. °The size, location, and chemical composition are all important variables that will determine the proper choice of action for you. Talk to your health care provider to better understand your situation so that you will minimize the risk of injury to yourself and your kidney.  °HOME CARE INSTRUCTIONS  °· Drink enough water and fluids to keep your urine clear or pale yellow. This will help you to pass the stone or stone fragments. °· Strain  all urine through the provided strainer. Keep all particulate matter and stones for your health care provider to see. The stone causing the pain may be as small as a grain of salt. It is very important to use the strainer each and every time you pass your urine. The collection of your stone will allow your health care provider to analyze it and verify that a stone has actually passed. The stone analysis will often identify what you can do to reduce the incidence of recurrences. °· Only take over-the-counter or prescription medicines for pain, discomfort, or fever as directed by your health care provider. °· Keep all follow-up visits as told by your health care provider. This is important. °· Get follow-up X-rays if required. The absence of pain does not always mean that the stone has passed. It may have only stopped moving. If the urine remains completely obstructed, it can cause loss of kidney function or even complete destruction of the kidney. It is your responsibility to make sure X-rays and follow-ups are completed. Ultrasounds of the kidney can show blockages and the status of the kidney. Ultrasounds are not associated with any radiation and can be performed easily in a matter of minutes. °· Make changes to your daily diet as told by your health care provider. You may be told to: °¨ Limit the amount of salt that you eat. °¨ Eat 5 or more servings of fruits and vegetables each day. °¨ Limit the amount of meat, poultry, fish, and eggs that you eat. °· Collect a 24-hour urine sample as told by your health care provider. You may need to collect another urine sample every 6-12   months. °SEEK MEDICAL CARE IF: °· You experience pain that is progressive and unresponsive to any pain medicine you have been prescribed. °SEEK IMMEDIATE MEDICAL CARE IF:  °· Pain cannot be controlled with the prescribed medicine. °· You have a fever or shaking chills. °· The severity or intensity of pain increases over 18 hours and is not  relieved by pain medicine. °· You develop a new onset of abdominal pain. °· You feel faint or pass out. °· You are unable to urinate. °  °This information is not intended to replace advice given to you by your health care provider. Make sure you discuss any questions you have with your health care provider. °  °Document Released: 12/19/2004 Document Revised: 09/09/2014 Document Reviewed: 05/22/2012 °Elsevier Interactive Patient Education ©2016 [employer] ° °

## 2014-10-15 NOTE — ED Provider Notes (Signed)
CSN: [id number]     Arrival date & time 10/15/14  6301 History   First MD Initiated Contact with Patient 10/15/14 [id number]     Chief Complaint  Patient presents with  . Flank Pain     (Consider location/radiation/quality/duration/timing/severity/associated sxs/prior Treatment) HPI Comments: Patient presents to the ER for evaluation of left lower back and flank pain. Patient reports symptoms began last night. Patient has had constant and severe pain associated with nausea and vomiting. She has not had any known fever. She has noticed decreased urine output, no hematuria or dysuria.  Patient is a 59 y.o. female presenting with flank pain.  Flank Pain    Past Medical History  Diagnosis Date  . Hypertension   . Anxiety   . Depression 2008  . COPD (chronic obstructive pulmonary disease) ([place]) 2011  . GERD (gastroesophageal reflux disease) 1994  . Migraine 2008  . PTSD (post-traumatic stress disorder)   . Diabetes mellitus without complication [facility])    Past Surgical History  Procedure Laterality Date  . Appendectomy  1975  . Cesarean section  1994  . Knee surgery Right 2012    meniscal tear repair  . Carpal tunnel release    . Knee arthroscopy Right   . Tonsillectomy    . Breast cyst excision Bilateral    Family History  Problem Relation Age of Onset  . Colon cancer Mother 32  . Hyperlipidemia Mother   . Cancer Sister     breast  . Diabetes Sister   . Hyperlipidemia Sister   . Bladder Cancer Father    Social History  Substance Use Topics  . Smoking status: Current Every Day Smoker -- 1.00 packs/day    Types: Cigarettes  . Smokeless tobacco: Never Used  . Alcohol Use: No   OB History    No data available     Review of Systems  Genitourinary: Positive for flank pain.  All other systems reviewed and are negative.     Allergies  Statins; Amoxicillin; Fibrates; and Sulfa antibiotics  Home Medications   Prior to Admission medications   Medication Sig Start  Date End Date Taking? Authorizing Provider  atenolol (TENORMIN) 50 MG tablet Take 1 tablet (50 mg total) by mouth 2 (two) times daily. 08/10/14  Yes Frazier Richards, MD  diclofenac (VOLTAREN) 75 MG EC tablet Take 1 tablet (75 mg total) by mouth 2 (two) times daily as needed (back pain). 02/26/14  Yes Frazier Richards, MD  gabapentin (NEURONTIN) 100 MG capsule Take 1 capsule (100 mg total) by mouth 3 (three) times daily as needed (for headache). 08/13/14  Yes Frazier Richards, MD  metFORMIN (GLUCOPHAGE) 500 MG tablet Take 1 tablet (500 mg total) by mouth 2 (two) times daily with a meal. 08/10/14  Yes Frazier Richards, MD  Multiple Vitamin (MULTIVITAMIN WITH MINERALS) TABS tablet Take 1 tablet by mouth daily.   Yes Historical Provider, MD  Omega-3 Fatty Acids (FISH OIL PO) Take 1 capsule by mouth daily.   Yes Historical Provider, MD  omeprazole (PRILOSEC) 20 MG capsule Take 20 mg by mouth daily.   Yes Historical Provider, MD  topiramate (TOPAMAX) 100 MG tablet Take 1 tablet (100 mg total) by mouth 2 (two) times daily. 08/10/14  Yes Frazier Richards, MD  venlafaxine XR (EFFEXOR-XR) 75 MG 24 hr capsule Take 3 capsules (225 mg total) by mouth daily with breakfast. 08/10/14  Yes Frazier Richards, MD  Blood Glucose Monitoring Suppl (ONE TOUCH ULTRA 2) W/DEVICE  KIT 1 Device by Does not apply route daily. 08/10/14   Frazier Richards, MD  clobetasol ointment (TEMOVATE) 4.26 % Apply 1 application topically 2 (two) times daily. Patient not taking: Reported on 10/15/2014 02/26/14   Frazier Richards, MD  glucose blood (ONE TOUCH ULTRA TEST) test strip Use as instructed 08/10/14   Frazier Richards, MD  Lancets [facility] ULTRASOFT) lancets Use as instructed 08/10/14   Frazier Richards, MD  oxyCODONE-acetaminophen (PERCOCET) 5-325 MG tablet Take 1-2 tablets by mouth every 4 (four) hours as needed. 10/15/14   Orpah Greek, MD  tamsulosin (FLOMAX) 0.4 MG CAPS capsule Take 1 capsule (0.4 mg total) by mouth daily. 10/15/14   Orpah Greek, MD   triamcinolone ointment (KENALOG) 0.5 % Apply 1 application topically 2 (two) times daily. Patient not taking: Reported on 10/15/2014 02/27/14   Frazier Richards, MD   BP 156/66 mmHg  Pulse 68  Temp(Src) 97.9 F (36.6 C) (Oral)  Resp 18  SpO2 100% Physical Exam  Constitutional: She is oriented to person, place, and time. She appears well-developed and well-nourished. No distress.  HENT:  Head: Normocephalic and atraumatic.  Right Ear: Hearing normal.  Left Ear: Hearing normal.  Nose: Nose normal.  Mouth/Throat: Oropharynx is clear and moist and mucous membranes are normal.  Eyes: Conjunctivae and EOM are normal. Pupils are equal, round, and reactive to light.  Neck: Normal range of motion. Neck supple.  Cardiovascular: Regular rhythm, S1 normal and S2 normal.  Exam reveals no gallop and no friction rub.   No murmur heard. Pulmonary/Chest: Effort normal and breath sounds normal. No respiratory distress. She exhibits no tenderness.  Abdominal: Soft. Normal appearance and bowel sounds are normal. There is no hepatosplenomegaly. There is no tenderness. There is CVA tenderness (left). There is no rebound, no guarding, no tenderness at McBurney's point and negative Murphy's sign. No hernia.  Musculoskeletal: Normal range of motion.  Neurological: She is alert and oriented to person, place, and time. She has normal strength. No cranial nerve deficit or sensory deficit. Coordination normal. GCS eye subscore is 4. GCS verbal subscore is 5. GCS motor subscore is 6.  Skin: Skin is warm, dry and intact. No rash noted. No cyanosis.  Psychiatric: She has a normal mood and affect. Her speech is normal and behavior is normal. Thought content normal.  Nursing note and vitals reviewed.   ED Course  Procedures (including critical care time) Labs Review Labs Reviewed  CBC WITH DIFFERENTIAL/PLATELET - Abnormal; Notable for the following:    WBC 15.8 (*)    Hemoglobin 15.6 (*)    HCT 46.6 (*)    Neutro  Abs 11.6 (*)    All other components within normal limits  COMPREHENSIVE METABOLIC PANEL - Abnormal; Notable for the following:    Glucose, Bld 162 (*)    Calcium 10.5 (*)    Total Protein 8.5 (*)    All other components within normal limits  URINALYSIS, ROUTINE W REFLEX MICROSCOPIC (NOT AT [facility]) - Abnormal; Notable for the following:    Color, Urine AMBER (*)    APPearance TURBID (*)    Hgb urine dipstick LARGE (*)    Protein, ur 30 (*)    Leukocytes, UA SMALL (*)    All other components within normal limits  URINE MICROSCOPIC-ADD ON    Imaging Review Ct Renal Stone Study  10/15/2014  CLINICAL DATA:  One day history of left flank pain EXAM: CT ABDOMEN AND PELVIS WITHOUT CONTRAST  TECHNIQUE: Multidetector CT imaging of the abdomen and pelvis was performed following the standard protocol without oral or intravenous contrast material administration. COMPARISON:  January 06, 2008 FINDINGS: Lower chest:  Lung bases are clear. Hepatobiliary: Liver is prominent, measuring 21.5 cm in length. There is diffuse hepatic steatosis. No focal liver lesions are identified. Gallbladder wall is not appreciably thickened. There is no biliary duct dilatation. Pancreas: No mass or inflammatory focus. Spleen: No splenic lesions are identified.  Spleen is not enlarged. Adrenals/Urinary Tract: Adrenals appear normal bilaterally. On the right, there is a 2 mm calculus in the upper pole region. A second 2 mm calculus is noted in the mid right kidney. A 1 mm calculus is noted in the mid to lower pole right kidney. There is no right renal mass or hydronephrosis. On axial slice 41 series 2, there is a 1 mm calculus in the proximal right ureter near the ureteropelvic junction. No other ureteral calculus is appreciable on the right. On the left, there is severe hydronephrosis. There is mild left renal edema. No left renal mass. There is a 1 mm calculus in the posterior mid left kidney, nonobstructing. There is a 3 mm calculus  at the left ureterovesical junction. No other left-sided ureteral calculus seen. The urinary bladder is midline with normal wall thickness. Stomach/Bowel: There are multiple sigmoid and descending colonic diverticula without diverticulitis. A few scattered diverticular noted elsewhere in the colon. There is no bowel wall or mesenteric thickening. No bowel obstruction. No free air or portal venous air. Vascular/Lymphatic: There is atherosclerotic change in the aorta and iliac arteries. There is no demonstrable abdominal aortic aneurysm. There is no adenopathy in the abdomen or pelvis. Reproductive: There is a small follicle in the left ovary measuring 1.5 x 1.4 cm. Beyond this physiologic follicle, there is no pelvic mass or pelvic fluid collection. Uterus is not appear enlarged. Other: There is no periappendiceal region inflammation. Appendix absent. No abscess or ascites. Musculoskeletal: There is no blastic or lytic bone lesion. There are degenerative changes in the lumbar spine. There no intramuscular lesions or abdominal wall lesions. IMPRESSION: There is a 3 mm calculus at the left ureterovesical junction causing severe hydronephrosis on the left. There is a 1 mm calculus in the proximal right ureter, not causing hydronephrosis. Nonobstructing calculi in each kidney. Appendix absent. No periappendiceal region inflammation. No bowel obstruction. No abscess. Prominent liver with diffuse hepatic steatosis. Electronically Signed   By: Lowella Grip III M.D.   On: 10/15/2014 09:28   I have personally reviewed and evaluated these images and lab results as part of my medical decision-making.   EKG Interpretation None      MDM   Final diagnoses:  Renal colic on left side    Patient presents to the ER for evaluation of left flank pain. Patient reports symptoms began last night. Patient does not have a previous history of kidney stones. Workup today confirms distal UVJ stone with hydronephrosis.  Patient has had significant improvement with IV analgesia and Toradol. She will be referred to urology as an outpatient. She was prescribed Flomax and Percocet for analgesia. Return if her symptoms are uncontrolled.    Orpah Greek, MD 10/15/14 1130

## 2014-10-15 NOTE — ED Notes (Signed)
Per pt, states left flank pain since last night

## 2014-10-15 NOTE — ED Notes (Signed)
Eber Hong to start IV and draw

## 2014-10-17 ENCOUNTER — Emergency Department (HOSPITAL_COMMUNITY)
Admission: EM | Admit: 2014-10-17 | Discharge: 2014-10-17 | Disposition: A | Discharge status: Home / Self Care | Payer: Medicare Other | Attending: Emergency Medicine | Admitting: Emergency Medicine

## 2014-10-17 ENCOUNTER — Encounter (HOSPITAL_COMMUNITY): Payer: Self-pay

## 2014-10-17 DIAGNOSIS — I10 Essential (primary) hypertension: Secondary | ICD-10-CM | POA: Insufficient documentation

## 2014-10-17 DIAGNOSIS — Z720 Tobacco use: Secondary | ICD-10-CM | POA: Insufficient documentation

## 2014-10-17 DIAGNOSIS — F419 Anxiety disorder, unspecified: Secondary | ICD-10-CM | POA: Insufficient documentation

## 2014-10-17 DIAGNOSIS — G43909 Migraine, unspecified, not intractable, without status migrainosus: Secondary | ICD-10-CM | POA: Diagnosis not present

## 2014-10-17 DIAGNOSIS — K219 Gastro-esophageal reflux disease without esophagitis: Secondary | ICD-10-CM | POA: Insufficient documentation

## 2014-10-17 DIAGNOSIS — Z87442 Personal history of urinary calculi: Secondary | ICD-10-CM | POA: Insufficient documentation

## 2014-10-17 DIAGNOSIS — R109 Unspecified abdominal pain: Secondary | ICD-10-CM | POA: Diagnosis not present

## 2014-10-17 DIAGNOSIS — Z9049 Acquired absence of other specified parts of digestive tract: Secondary | ICD-10-CM | POA: Insufficient documentation

## 2014-10-17 DIAGNOSIS — F4310 Post-traumatic stress disorder, unspecified: Secondary | ICD-10-CM | POA: Diagnosis not present

## 2014-10-17 DIAGNOSIS — Z880 Allergy status to penicillin: Secondary | ICD-10-CM | POA: Diagnosis not present

## 2014-10-17 DIAGNOSIS — F329 Major depressive disorder, single episode, unspecified: Secondary | ICD-10-CM | POA: Diagnosis not present

## 2014-10-17 DIAGNOSIS — J449 Chronic obstructive pulmonary disease, unspecified: Secondary | ICD-10-CM | POA: Diagnosis not present

## 2014-10-17 DIAGNOSIS — Z79899 Other long term (current) drug therapy: Secondary | ICD-10-CM | POA: Diagnosis not present

## 2014-10-17 DIAGNOSIS — E119 Type 2 diabetes mellitus without complications: Secondary | ICD-10-CM | POA: Diagnosis not present

## 2014-10-17 HISTORY — DX: Calculus of kidney: N20.0

## 2014-10-17 LAB — URINALYSIS, ROUTINE W REFLEX MICROSCOPIC
Bilirubin Urine: NEGATIVE
GLUCOSE, UA: NEGATIVE mg/dL
Hgb urine dipstick: NEGATIVE
KETONES UR: NEGATIVE mg/dL
LEUKOCYTES UA: NEGATIVE
NITRITE: NEGATIVE
Protein, ur: NEGATIVE mg/dL
SPECIFIC GRAVITY, URINE: 1.019 (ref 1.005–1.030)
Urobilinogen, UA: 0.2 mg/dL (ref 0.0–1.0)
pH: 7 (ref 5.0–8.0)

## 2014-10-17 MED ORDER — IBUPROFEN 800 MG PO TABS
800.0000 mg | ORAL_TABLET | Freq: Once | ORAL | Status: AC
  Administered 2014-10-17: 800 mg via ORAL
  Filled 2014-10-17: qty 1

## 2014-10-17 NOTE — ED Provider Notes (Signed)
CSN: [id number]     Arrival date & time 10/17/14  1428 History   First MD Initiated Contact with Patient 10/17/14 1502     Chief Complaint  Patient presents with  . Flank Pain     (Consider location/radiation/quality/duration/timing/severity/associated sxs/prior Treatment) HPI   Marie [name] is a 59 y.o. female with PMH significant for HTN, GERD, COPD, DM who was seen on 10/15/14 and found to have a left UVJ stone.  She was given Percocet and Flomax upon discharge. She did pass the stone 10/16/14.  She attempted to follow up with urology; however, they told her they could not see her for a few weeks.  Patient reports she was told she had a UTI, but did not receive abx, so she returned here.  She states her pain has improved since passing the stone.  She has been taking her Percocet, but she has not had any today.  Endorses back pain, left flank pain, dysuria, and hematuria.  Denies fevers, chills, CP, SOB, abdominal pain, N/V/D, or increased urinary frequency.  Past Medical History  Diagnosis Date  . Hypertension   . Anxiety   . Depression 2008  . COPD (chronic obstructive pulmonary disease) ([place]) 2011  . GERD (gastroesophageal reflux disease) 1994  . Migraine 2008  . PTSD (post-traumatic stress disorder)   . Diabetes mellitus without complication ([place])   . Kidney stone    Past Surgical History  Procedure Laterality Date  . Appendectomy  1975  . Cesarean section  1994  . Knee surgery Right 2012    meniscal tear repair  . Carpal tunnel release    . Knee arthroscopy Right   . Tonsillectomy    . Breast cyst excision Bilateral    Family History  Problem Relation Age of Onset  . Colon cancer Mother 24  . Hyperlipidemia Mother   . Cancer Sister     breast  . Diabetes Sister   . Hyperlipidemia Sister   . Bladder Cancer Father    Social History  Substance Use Topics  . Smoking status: Current Every Day Smoker -- 1.00 packs/day    Types: Cigarettes  . Smokeless  tobacco: Never Used  . Alcohol Use: No   OB History    No data available     Review of Systems All other systems negative unless otherwise stated in HPI    Allergies  Statins; Amoxicillin; Fibrates; and Sulfa antibiotics  Home Medications   Prior to Admission medications   Medication Sig Start Date End Date Taking? Authorizing Provider  acetaminophen (TYLENOL) 500 MG tablet Take 1,000 mg by mouth every 8 (eight) hours as needed for moderate pain or headache.   Yes Historical Provider, MD  atenolol (TENORMIN) 50 MG tablet Take 1 tablet (50 mg total) by mouth 2 (two) times daily. 08/10/14  Yes Frazier Richards, MD  diclofenac (VOLTAREN) 75 MG EC tablet Take 1 tablet (75 mg total) by mouth 2 (two) times daily as needed (back pain). 02/26/14  Yes Frazier Richards, MD  metFORMIN (GLUCOPHAGE) 500 MG tablet Take 1 tablet (500 mg total) by mouth 2 (two) times daily with a meal. 08/10/14  Yes Frazier Richards, MD  Omega-3 Fatty Acids (FISH OIL PO) Take 1 capsule by mouth daily.   Yes Historical Provider, MD  omeprazole (PRILOSEC) 20 MG capsule Take 20 mg by mouth daily.   Yes Historical Provider, MD  oxyCODONE-acetaminophen (PERCOCET) 5-325 MG tablet Take 1-2 tablets by mouth every 4 (four) hours as  needed. 10/15/14  Yes Orpah Greek, MD  tamsulosin (FLOMAX) 0.4 MG CAPS capsule Take 1 capsule (0.4 mg total) by mouth daily. 10/15/14  Yes Orpah Greek, MD  topiramate (TOPAMAX) 100 MG tablet Take 1 tablet (100 mg total) by mouth 2 (two) times daily. 08/10/14  Yes Frazier Richards, MD  venlafaxine XR (EFFEXOR-XR) 75 MG 24 hr capsule Take 3 capsules (225 mg total) by mouth daily with breakfast. 08/10/14  Yes Frazier Richards, MD  Blood Glucose Monitoring Suppl (ONE TOUCH ULTRA 2) W/DEVICE KIT 1 Device by Does not apply route daily. 08/10/14   Frazier Richards, MD  clobetasol ointment (TEMOVATE) 6.46 % Apply 1 application topically 2 (two) times daily. Patient not taking: Reported on 10/15/2014 02/26/14   Frazier Richards, MD  gabapentin (NEURONTIN) 100 MG capsule Take 1 capsule (100 mg total) by mouth 3 (three) times daily as needed (for headache). Patient not taking: Reported on 10/17/2014 08/13/14   Frazier Richards, MD  glucose blood (ONE TOUCH ULTRA TEST) test strip Use as instructed 08/10/14   Frazier Richards, MD  Lancets [facility] ULTRASOFT) lancets Use as instructed 08/10/14   Frazier Richards, MD  triamcinolone ointment (KENALOG) 0.5 % Apply 1 application topically 2 (two) times daily. Patient not taking: Reported on 10/15/2014 02/27/14   Frazier Richards, MD   BP 118/66 mmHg  Pulse 83  Temp(Src) 98.2 F (36.8 C) (Oral)  Resp 16  SpO2 94% Physical Exam  Constitutional: She is oriented to person, place, and time. She appears well-developed and well-nourished.  HENT:  Head: Normocephalic and atraumatic.  Mouth/Throat: Oropharynx is clear and moist.  Eyes: Conjunctivae are normal. Pupils are equal, round, and reactive to light.  Neck: Normal range of motion. Neck supple.  Cardiovascular: Normal rate, regular rhythm and normal heart sounds.   No murmur heard. Pulmonary/Chest: Effort normal and breath sounds normal. No respiratory distress. She has no wheezes. She has no rales.  Abdominal: Soft. Bowel sounds are normal. She exhibits no distension. There is no tenderness. There is CVA tenderness. There is no rigidity and no guarding.  Musculoskeletal: Normal range of motion.  Lymphadenopathy:    She has no cervical adenopathy.  Neurological: She is alert and oriented to person, place, and time.  Skin: Skin is warm and dry.  Psychiatric: She has a normal mood and affect. Her behavior is normal.    ED Course  Procedures (including critical care time) Labs Review Labs Reviewed  URINALYSIS, [place] (NOT AT [facility]) - Abnormal; Notable for the following:    APPearance CLOUDY (*)    All other components within normal limits  URINE CULTURE    Imaging Review No results found. I have  personally reviewed and evaluated these images and lab results as part of my medical decision-making.   EKG Interpretation None      MDM   Final diagnoses:  Left flank pain   Patient found to have distal UVJ stone with hydronephrosis 10/15/14 and presenting with concern for UTI.  Endorses dysuria, hematuria, left flank pain, and left back pain.  No fevers, chills, or abdominal pain.  VSS, NAD, patient appears non-toxic.  On exam, she displays left CVA tenderness.  No abdominal tenderness, rebound, guarding, or rigidity.  Heart RRR, Lungs CTAB.  Will obtain UA to evaluate for infection.  Motrin given for pain.   UA shows no signs of infection. Patient will follow up with outpatient urology.  Patient stable  for discharge. Blood pressure 118/66, pulse 83, temperature 98.2 F (36.8 C), temperature source Oral, resp. rate 16, SpO2 94 %.  Patient agrees and acknowledges the above plan for discharge.  Case has been discussed with Dr. Zenia Resides who agrees with the above plan for discharge.    Gloriann Loan, PA-C 10/17/14 1636  Lacretia Leigh, MD 10/18/14 [phone]

## 2014-10-17 NOTE — ED Notes (Signed)
She states she was seen here a couple of days ago for left flank pain and was dx with ureteral stone and u.t.i.  She c/o persistent left flank pain and dysuria.  She states she was prescribed "pills to help me pass the (kidney) stone--and I did--but I wasn't prescribed antibiotics".  She is in no distress.  She brought the stone with her.

## 2014-10-17 NOTE — Discharge Instructions (Signed)
Dietary Guidelines to Help Prevent Kidney Stones Your risk of kidney stones can be decreased by adjusting the foods you eat. The most important thing you can do is drink enough fluid. You should drink enough fluid to keep your urine clear or pale yellow. The following guidelines provide specific information for the type of kidney stone you have had. GUIDELINES ACCORDING TO TYPE OF KIDNEY STONE Calcium Oxalate Kidney Stones  Reduce the amount of salt you eat. Foods that have a lot of salt cause your body to release excess calcium into your urine. The excess calcium can combine with a substance called oxalate to form kidney stones.  Reduce the amount of animal protein you eat if the amount you eat is excessive. Animal protein causes your body to release excess calcium into your urine. Ask your dietitian how much protein from animal sources you should be eating.  Avoid foods that are high in oxalates. If you take vitamins, they should have less than 500 mg of vitamin C. Your body turns vitamin C into oxalates. You do not need to avoid fruits and vegetables high in vitamin C. Calcium Phosphate Kidney Stones  Reduce the amount of salt you eat to help prevent the release of excess calcium into your urine.  Reduce the amount of animal protein you eat if the amount you eat is excessive. Animal protein causes your body to release excess calcium into your urine. Ask your dietitian how much protein from animal sources you should be eating.  Get enough calcium from food or take a calcium supplement (ask your dietitian for recommendations). Food sources of calcium that do not increase your risk of kidney stones include:  Broccoli.  Dairy products, such as cheese and yogurt.  Pudding. Uric Acid Kidney Stones  Do not have more than 6 oz of animal protein per day. FOOD SOURCES Animal Protein Sources  Meat (all types).  Poultry.  Eggs.  Fish, seafood. Foods High in Salt  Salt seasonings.  Soy  sauce.  Teriyaki sauce.  Cured and processed meats.  Salted crackers and snack foods.  Fast food.  Canned soups and most canned foods. Foods High in Oxalates  Grains:  Amaranth.  Barley.  Grits.  Wheat germ.  Bran.  Buckwheat flour.  All bran cereals.  Pretzels.  Whole wheat bread.  Vegetables:  Beans (wax).  Beets and beet greens.  Collard greens.  Eggplant.  Escarole.  Leeks.  Okra.  Parsley.  Rutabagas.  Spinach.  Swiss chard.  Tomato paste.  Fried potatoes.  Sweet potatoes.  Fruits:  Red currants.  Figs.  Kiwi.  Rhubarb.  Meat and Other Protein Sources:  Beans (dried).  Soy burgers and other soybean products.  Miso.  Nuts (peanuts, almonds, pecans, cashews, hazelnuts).  Nut butters.  Sesame seeds and tahini (paste made of sesame seeds).  Poppy seeds.  Beverages:  Chocolate drink mixes.  Soy milk.  Instant iced tea.  Juices made from high-oxalate fruits or vegetables.  Other:  Carob.  Chocolate.  Fruitcake.  Marmalades.   This information is not intended to replace advice given to you by your health care provider. Make sure you discuss any questions you have with your health care provider.   Document Released: 04/15/2010 Document Revised: 12/24/2012 Document Reviewed: 11/15/2012 Elsevier Interactive Patient Education 2016 Elsevier Inc.  

## 2014-10-19 LAB — URINE CULTURE

## 2014-10-27 DIAGNOSIS — R350 Frequency of micturition: Secondary | ICD-10-CM | POA: Diagnosis not present

## 2014-10-27 DIAGNOSIS — N3946 Mixed incontinence: Secondary | ICD-10-CM | POA: Diagnosis not present

## 2014-10-27 DIAGNOSIS — N200 Calculus of kidney: Secondary | ICD-10-CM | POA: Diagnosis not present

## 2014-10-27 DIAGNOSIS — R351 Nocturia: Secondary | ICD-10-CM | POA: Diagnosis not present

## 2014-11-03 ENCOUNTER — Encounter: Payer: Self-pay | Admitting: Family Medicine

## 2014-11-03 ENCOUNTER — Ambulatory Visit (INDEPENDENT_AMBULATORY_CARE_PROVIDER_SITE_OTHER): Payer: Medicare Other | Admitting: Family Medicine

## 2014-11-03 VITALS — BP 125/60 | HR 68 | Temp 98.6°F | Ht 64.0 in | Wt 214.7 lb

## 2014-11-03 DIAGNOSIS — I10 Essential (primary) hypertension: Secondary | ICD-10-CM

## 2014-11-03 DIAGNOSIS — E119 Type 2 diabetes mellitus without complications: Secondary | ICD-10-CM

## 2014-11-03 DIAGNOSIS — Z23 Encounter for immunization: Secondary | ICD-10-CM

## 2014-11-03 LAB — POCT GLYCOSYLATED HEMOGLOBIN (HGB A1C): Hemoglobin A1C: 6.1

## 2014-11-03 NOTE — Patient Instructions (Signed)

## 2014-11-04 NOTE — Progress Notes (Signed)
   Subjective:   Marie [name] is a 59 y.o. female with a history of DM, HTN, migraines here for DM f/u  CHRONIC DIABETES  Disease Monitoring  Blood Sugar Ranges: 130-270  Polyuria: no   Visual problems: no   Medication Compliance: yes  Medication Side Effects  Hypoglycemia: no   Preventitive Health [name]  Eye Exam: due  Foot Exam: done today  Diet pattern: poor, lots of junk food and sweets  Exercise: minimal    Review of Systems:  Per HPI. All other systems reviewed and are negative.   PMH, PSH, Medications, Allergies, and FmHx reviewed and updated in EMR.  Social History: current smoker  Objective:  BP 125/60 mmHg  Pulse 68  Temp(Src) 98.6 F (37 C) (Oral)  Ht 5\' 4"  (1.626 m)  Wt 214 lb 11.2 oz (97.387 kg)  BMI 36.83 kg/m2  Gen:  59 y.o. female in NAD HEENT: NCAT, MMM, EOMI, PERRL, anicteric sclerae CV: RRR, no MRG, no JVD Resp: Non-labored, CTAB, no wheezes noted Abd: Soft, NTND, BS present, no guarding or organomegaly Ext: WWP, no edema MSK: Full ROM, strength intact Neuro: Alert and oriented, speech normal      Chemistry      Component Value Date/Time   NA 139 10/15/2014 0909   K 3.9 10/15/2014 0909   CL 105 10/15/2014 0909   CO2 23 10/15/2014 0909   BUN 15 10/15/2014 0909   CREATININE 0.76 10/15/2014 0909   CREATININE 0.60 03/05/2013 1151      Component Value Date/Time   CALCIUM 10.5* 10/15/2014 0909   ALKPHOS 87 10/15/2014 0909   AST 31 10/15/2014 0909   ALT 25 10/15/2014 0909   BILITOT 0.4 10/15/2014 0909      Lab Results  Component Value Date   WBC 15.8* 10/15/2014   HGB 15.6* 10/15/2014   HCT 46.6* 10/15/2014   MCV 95.7 10/15/2014   PLT 356 10/15/2014   Lab Results  Component Value Date   TSH 1.615 04/07/2009   Lab Results  Component Value Date   HGBA1C 6.1 11/03/2014   Assessment & Plan:     Marie [name] is a 59 y.o. female here for DM, HTN  Essential hypertension, benign Well controlled today, 125/60,  asymptomatic and tolerating meds well - continue atenolol (also for migraine prophylaxis) - if another agent needed will plan to add ACE for renal protection  Type 2 diabetes mellitus Well controlled, tolerating metformin - continue metformin 500mg  bid - continue to improve physical activity and decrease simple sugars in the diet - needs microalbumin and eye exam, plan to address at next visit      Beverlyn Roux, MD, MPH [facility] PGY-3 11/04/2014 5:06 PM

## 2014-11-04 NOTE — Assessment & Plan Note (Signed)
Well controlled, tolerating metformin - continue metformin 500mg  bid - continue to improve physical activity and decrease simple sugars in the diet - needs microalbumin and eye exam, plan to address at next visit

## 2014-11-04 NOTE — Assessment & Plan Note (Signed)
Well controlled today, 125/60, asymptomatic and tolerating meds well - continue atenolol (also for migraine prophylaxis) - if another agent needed will plan to add ACE for renal protection

## 2014-12-07 ENCOUNTER — Encounter: Payer: Self-pay | Admitting: Family Medicine

## 2014-12-07 ENCOUNTER — Ambulatory Visit (INDEPENDENT_AMBULATORY_CARE_PROVIDER_SITE_OTHER): Payer: Medicare Other | Admitting: Family Medicine

## 2014-12-07 VITALS — BP 136/73 | HR 62 | Temp 98.5°F | Wt 213.0 lb

## 2014-12-07 DIAGNOSIS — K047 Periapical abscess without sinus: Secondary | ICD-10-CM | POA: Diagnosis not present

## 2014-12-07 MED ORDER — CEPHALEXIN 500 MG PO CAPS: 500.0000 mg | ORAL_CAPSULE | Freq: Three times a day (TID) | ORAL | Status: DC

## 2014-12-07 MED ORDER — HYDROCODONE-ACETAMINOPHEN 5-325 MG PO TABS: 1.0000 | ORAL_TABLET | Freq: Four times a day (QID) | ORAL | Status: DC | PRN

## 2014-12-07 NOTE — Patient Instructions (Signed)
Dental Abscess A dental abscess is pus in or around a tooth. HOME CARE  Take medicines only as told by your dentist.  If you were prescribed antibiotic medicine, finish all of it even if you start to feel better.  Rinse your mouth (gargle) often with salt water.  Do not drive or use heavy machinery, like a lawn mower, while taking pain medicine.  Do not apply heat to the outside of your mouth.  Keep all follow-up visits as told by your dentist. This is important. GET HELP IF:  Your pain is worse, and medicine does not help. GET HELP RIGHT AWAY IF:  You have a fever or chills.  Your symptoms suddenly get worse.  You have a very bad headache.  You have problems breathing or swallowing.  You have trouble opening your mouth.  You have puffiness (swelling) in your neck or around your eye.   This information is not intended to replace advice given to you by your health care provider. Make sure you discuss any questions you have with your health care provider.   Document Released: 05/05/2014 Document Reviewed: 05/05/2014 Elsevier Interactive Patient Education 2016 Elsevier Inc.  

## 2014-12-11 NOTE — Progress Notes (Signed)
   Subjective:   VALAREE LAZZARO is a 59 y.o. female with a history of htn, dm here for tooth pain  Pt reports pain in her right upper teeth for 3 days that has been severe and brought her to tears at times. Tylenol and ibuprofen have not significantly decreased the pain. She does not have a dentist but her family has recommended she see Dr. Aida Puffer and assured her than he could see her quickly. No fever, chills, n/v/d.  Review of Systems:  Per HPI. All other systems reviewed and are negative.   PMH, PSH, Medications, Allergies, and FmHx reviewed and updated in EMR.  Social History: current smoker  Objective:  BP 136/73 mmHg  Pulse 62  Temp(Src) 98.5 F (36.9 C) (Oral)  Wt 213 lb (96.616 kg)  Gen:  59 y.o. female in NAD HEENT: NCAT, MMM, EOMI, PERRL, anicteric sclerae, erythema, tenderness and fluctuance at base of 3rd and 4th teeth on the lingual side CV: RRR, no MRG, no JVD Resp: Non-labored, CTAB, no wheezes noted Abd: Soft, NTND, BS present, no guarding or organomegaly Ext: WWP, no edema MSK: Full ROM, strength intact Neuro: Alert and oriented, speech normal      Chemistry      Component Value Date/Time   NA 139 10/15/2014 0909   K 3.9 10/15/2014 0909   CL 105 10/15/2014 0909   CO2 23 10/15/2014 0909   BUN 15 10/15/2014 0909   CREATININE 0.76 10/15/2014 0909   CREATININE 0.60 03/05/2013 1151      Component Value Date/Time   CALCIUM 10.5* 10/15/2014 0909   ALKPHOS 87 10/15/2014 0909   AST 31 10/15/2014 0909   ALT 25 10/15/2014 0909   BILITOT 0.4 10/15/2014 0909      Lab Results  Component Value Date   WBC 15.8* 10/15/2014   HGB 15.6* 10/15/2014   HCT 46.6* 10/15/2014   MCV 95.7 10/15/2014   PLT 356 10/15/2014   Lab Results  Component Value Date   TSH 1.615 04/07/2009   Lab Results  Component Value Date   HGBA1C 6.1 11/03/2014   Assessment & Plan:     ELLNORA ROCKMORE is a 59 y.o. female here for tooth pain  Dental abscess 3 days of tooth  pain, no fever/chills, normal vitals, erythema/tenderness fluctuance on inside of right upper gumline - patient does not have dentist but family sees Odie and I have encouraged her to follow-up with him as soon as possible - keflex (pt reports diarrhea with amox)    Beverlyn Roux, MD, MPH [facility] PGY-3 12/11/2014 11:00 AM

## 2014-12-11 NOTE — Assessment & Plan Note (Signed)
3 days of tooth pain, no fever/chills, normal vitals, erythema/tenderness fluctuance on inside of right upper gumline - patient does not have dentist but family sees Odie and I have encouraged her to follow-up with him as soon as possible - keflex (pt reports diarrhea with amox)

## 2015-01-08 ENCOUNTER — Ambulatory Visit (INDEPENDENT_AMBULATORY_CARE_PROVIDER_SITE_OTHER): Payer: Medicare Other | Admitting: Family Medicine

## 2015-01-08 ENCOUNTER — Encounter: Payer: Self-pay | Admitting: Family Medicine

## 2015-01-08 VITALS — BP 151/88 | HR 71 | Temp 98.3°F | Wt 204.0 lb

## 2015-01-08 DIAGNOSIS — B9789 Other viral agents as the cause of diseases classified elsewhere: Principal | ICD-10-CM

## 2015-01-08 DIAGNOSIS — J069 Acute upper respiratory infection, unspecified: Secondary | ICD-10-CM | POA: Diagnosis not present

## 2015-01-08 MED ORDER — HYDROCODONE-HOMATROPINE 5-1.5 MG/5ML PO SYRP: 5.0000 mL | ORAL_SOLUTION | Freq: Three times a day (TID) | ORAL | Status: DC | PRN

## 2015-01-08 MED ORDER — AZITHROMYCIN 250 MG PO TABS: ORAL_TABLET | ORAL | Status: DC

## 2015-01-08 MED ORDER — GUAIFENESIN ER 600 MG PO TB12: 600.0000 mg | ORAL_TABLET | Freq: Two times a day (BID) | ORAL | Status: DC | PRN

## 2015-01-08 NOTE — Progress Notes (Signed)
Date of Visit: 01/08/2015   HPI:  Patient presents for a same day appointment to discuss cold symptoms.  Has been sick for 4-5 days. Not getting worse or better, just staying the same. Main symptoms are cough and nasal congestion with constant blowing of nose. No fevers. Two other family  Members have had similar symptoms. Eating and drinking well. Cough is productive. Has had sore throat. No ear pain. Reports usually needing zpack when gets like this but hasn't needed one in a long time. Has not tried any medications at home other than cough drops. Patient thinks she has pneumonia.  ROS: See HPI  [place]: history of diabetes and hypertension   PHYSICAL EXAM: BP 151/88 mmHg  Pulse 71  Temp(Src) 98.3 F (36.8 C) (Oral)  Wt 204 lb (92.534 kg) Gen: NAD, pleasant, cooperative HEENT: normocephalic, atraumatic, oropharynx clear, moist mucous membranes, tympanic membranes clear bilaterally, nares with some congestion, no sinus tenderness, no anterior cervical lymphadenopathy Heart: regular rate and rhythm no murmur Lungs: clear to auscultation bilaterally normal work of breathing  Neuro: alert, grossly nonfocal, speech normal  ASSESSMENT/PLAN:  1. URI symptoms - likely viral URI. Patient well appearing with clear lung sounds today. Doubt bacterial pneumonia given lack of fever or objective findings on exam. Could have early atypical pneumonia.  - Patient requesting azithromycin rx. Discussed adverse risks of unnecessary antibiotic use with patient. As a snow storm is coming in tonight, I sent in rx for azithromycin so patient has this on hand should she need it. Advised waiting 2 days prior to starting, and only start if she's not any better then.  - Also rx mucinex and hycodan cough syrup.  - nasal saline spray, cough drops - follow up as needed  FOLLOW UP: Follow up as needed if symptoms worsen or fail to improve.    [place]. Ardelia Mems, [place]

## 2015-01-08 NOTE — Patient Instructions (Signed)
Sent in zpack. Wait until Sunday and only use it then if you're no better Also sent in mucinex, for congestion Printed prescription for cough syrup - use caution as this might make you very sleepy, use minimally Try nasal saline spray, cough drops Follow up next week if not feeling better  Be well, Dr. Ardelia Mems

## 2015-02-05 DIAGNOSIS — H40033 Anatomical narrow angle, bilateral: Secondary | ICD-10-CM | POA: Diagnosis not present

## 2015-02-05 DIAGNOSIS — E119 Type 2 diabetes mellitus without complications: Secondary | ICD-10-CM | POA: Diagnosis not present

## 2015-02-13 DIAGNOSIS — H578 Other specified disorders of eye and adnexa: Secondary | ICD-10-CM | POA: Diagnosis not present

## 2015-04-19 ENCOUNTER — Other Ambulatory Visit: Payer: Self-pay

## 2015-04-19 DIAGNOSIS — Z1231 Encounter for screening mammogram for malignant neoplasm of breast: Secondary | ICD-10-CM

## 2015-05-04 ENCOUNTER — Ambulatory Visit
Admission: RE | Admit: 2015-05-04 | Discharge: 2015-05-04 | Disposition: A | Discharge status: Home / Self Care | Payer: Medicare Other | Source: Ambulatory Visit

## 2015-05-04 DIAGNOSIS — Z1231 Encounter for screening mammogram for malignant neoplasm of breast: Secondary | ICD-10-CM | POA: Diagnosis not present

## 2015-08-10 ENCOUNTER — Ambulatory Visit (INDEPENDENT_AMBULATORY_CARE_PROVIDER_SITE_OTHER): Payer: Medicare Other | Admitting: Internal Medicine

## 2015-08-10 ENCOUNTER — Encounter: Payer: Self-pay | Admitting: Internal Medicine

## 2015-08-10 VITALS — BP 145/81 | HR 93 | Temp 98.0°F | Ht 64.0 in | Wt 206.0 lb

## 2015-08-10 DIAGNOSIS — E119 Type 2 diabetes mellitus without complications: Secondary | ICD-10-CM | POA: Diagnosis not present

## 2015-08-10 DIAGNOSIS — G43009 Migraine without aura, not intractable, without status migrainosus: Secondary | ICD-10-CM | POA: Diagnosis not present

## 2015-08-10 DIAGNOSIS — Z1159 Encounter for screening for other viral diseases: Secondary | ICD-10-CM | POA: Diagnosis not present

## 2015-08-10 DIAGNOSIS — F411 Generalized anxiety disorder: Secondary | ICD-10-CM | POA: Diagnosis not present

## 2015-08-10 DIAGNOSIS — I10 Essential (primary) hypertension: Secondary | ICD-10-CM

## 2015-08-10 LAB — POCT GLYCOSYLATED HEMOGLOBIN (HGB A1C): Hemoglobin A1C: 5.6

## 2015-08-10 LAB — BASIC METABOLIC PANEL WITH GFR
BUN: 6 mg/dL — ABNORMAL LOW (ref 7–25)
CALCIUM: 10.1 mg/dL (ref 8.6–10.4)
CO2: 20 mmol/L (ref 20–31)
Chloride: 104 mmol/L (ref 98–110)
Creat: 0.61 mg/dL (ref 0.50–1.05)
Glucose, Bld: 71 mg/dL (ref 65–99)
Potassium: 4.1 mmol/L (ref 3.5–5.3)
SODIUM: 140 mmol/L (ref 135–146)

## 2015-08-10 MED ORDER — OMEPRAZOLE 20 MG PO CPDR: 20.0000 mg | DELAYED_RELEASE_CAPSULE | Freq: Every day | ORAL | 3 refills | Status: DC

## 2015-08-10 MED ORDER — VENLAFAXINE HCL ER 75 MG PO CP24: 225.0000 mg | ORAL_CAPSULE | Freq: Every day | ORAL | 4 refills | Status: DC

## 2015-08-10 MED ORDER — LISINOPRIL 2.5 MG PO TABS: 2.5000 mg | ORAL_TABLET | Freq: Every day | ORAL | 1 refills | Status: DC

## 2015-08-10 MED ORDER — ATENOLOL 50 MG PO TABS: 50.0000 mg | ORAL_TABLET | Freq: Two times a day (BID) | ORAL | 4 refills | Status: DC

## 2015-08-10 MED ORDER — METFORMIN HCL 500 MG PO TABS: 500.0000 mg | ORAL_TABLET | Freq: Two times a day (BID) | ORAL | 3 refills | Status: DC

## 2015-08-10 MED ORDER — TOPIRAMATE 100 MG PO TABS: 100.0000 mg | ORAL_TABLET | Freq: Two times a day (BID) | ORAL | 4 refills | Status: DC

## 2015-08-10 NOTE — Patient Instructions (Signed)
Please return for a lab visit in 1 week. I have sent in referral for diabetes. I want you to follow up with me 3 months for an annual exam

## 2015-08-10 NOTE — Progress Notes (Signed)
   [name] Kerrin Mo, MD Phone: [phone]  Reason For Visit: Follow up Diabetes and HTN   HYPERTENSION- Exercise including walking recently  Blood pressure range: has been elevated at past visits. Chest pain, palpitations- none    Dyspnea- none Medications: Diet controlled Lightheadedness,Syncope-none   Edema- none   DIABETES Disease Monitoring: Not currently trying to loss weight. Has been walking lately  Blood Sugar range: 100 -200s, has been taking blood sugars randomly.  Polyuria/phagia/dipsia-None   Visual problems- None  Medications: Metformin 500 mg once daily  Compliance- No issues with taking medications Hypoglycemic symptoms- None    Monitoring Labs and Parameters Last A1C:  Lab Results  Component Value Date   HGBA1C 5.6 08/10/2015   Review of Symptoms - see HPI PMH - Smoking status noted.   Vital Signs reviewed  Objective: BP (!) 145/81 (BP Location: Left Arm, Patient Position: Sitting, Cuff Size: Large)   Pulse 93   Temp 98 F (36.7 C) (Oral)   Ht 5\' 4"  (1.626 m)   Wt 206 lb (93.4 kg)   BMI 35.36 kg/m  Gen: NAD, alert, cooperative with exam CV: RRR, good S1/S2, no murmur, 3 Resp: CTABL, no wheezes, non-labored Ext: No edema Diabetic Foot Exam - Simple   Simple Foot Form Diabetic Foot exam was performed with the following findings:  Yes 08/10/2015  2:00 PM  Visual Inspection No deformities, no ulcerations, no other skin breakdown bilaterally:  Yes Sensation Testing Intact to touch and monofilament testing bilaterally:  Yes Pulse Check Posterior Tibialis and Dorsalis pulse intact bilaterally:  Yes Comments     Assessment/Plan: See problem based a/p  Essential hypertension, benign Controlled,  - Will start lisinopril 2.5 mg for renal protection   Type 2 diabetes mellitus Controlled, A1C 5.6  - Completed diabetic foot exam  - HgB 123456 - BASIC METABOLIC PANEL WITH GFR - Microalbumin/Creatinine Ratio, Urine -  Ambulatory referral to Ophthalmology - lisinopril (ZESTRIL) 2.5 MG tablet; Take 1 tablet (2.5 mg total) by mouth daily.  Dispense: 30 tablet; Refill: 1 - Continue metformin 500 mg once daily

## 2015-08-11 ENCOUNTER — Encounter: Payer: Self-pay | Admitting: Internal Medicine

## 2015-08-11 LAB — HEPATITIS C ANTIBODY: HCV Ab: NEGATIVE

## 2015-08-11 LAB — MICROALBUMIN / CREATININE URINE RATIO
CREATININE, URINE: 26 mg/dL (ref 20–320)
MICROALB UR: 0.5 mg/dL
MICROALB/CREAT RATIO: 19 ug/mg{creat} (ref <30)

## 2015-08-11 NOTE — Assessment & Plan Note (Signed)
Controlled,  - Will start lisinopril 2.5 mg for renal protection

## 2015-08-11 NOTE — Assessment & Plan Note (Signed)
Controlled, A1C 5.6  - Completed diabetic foot exam  - HgB 123456 - BASIC METABOLIC PANEL WITH GFR - Microalbumin/Creatinine Ratio, Urine - Ambulatory referral to Ophthalmology - lisinopril (ZESTRIL) 2.5 MG tablet; Take 1 tablet (2.5 mg total) by mouth daily.  Dispense: 30 tablet; Refill: 1 - Continue metformin 500 mg once daily

## 2015-08-19 ENCOUNTER — Telehealth: Payer: Self-pay | Admitting: Internal Medicine

## 2015-08-19 ENCOUNTER — Other Ambulatory Visit: Payer: Self-pay

## 2015-08-19 DIAGNOSIS — K047 Periapical abscess without sinus: Secondary | ICD-10-CM

## 2015-08-19 DIAGNOSIS — I10 Essential (primary) hypertension: Secondary | ICD-10-CM

## 2015-08-19 IMAGING — CR DG LUMBAR SPINE COMPLETE 4+V
5 series · 5 of 5 positions shown · non-contrast
Comparison: CT of the abdomen and pelvis 01/06/2008

CLINICAL DATA: Chronic midline low back pain for years. Recent fall
1 month ago.

EXAM:
LUMBAR SPINE - COMPLETE 4+ VIEW

[t l-spine a.p.]
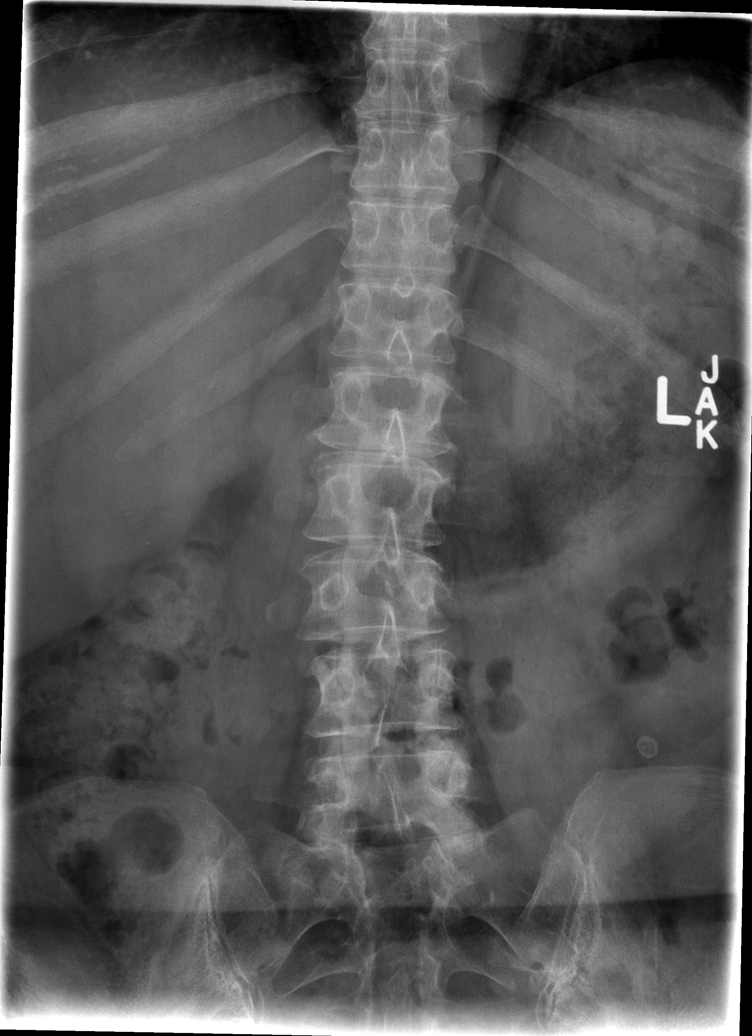

[t l-spine oblique exposure (1 of 2)]
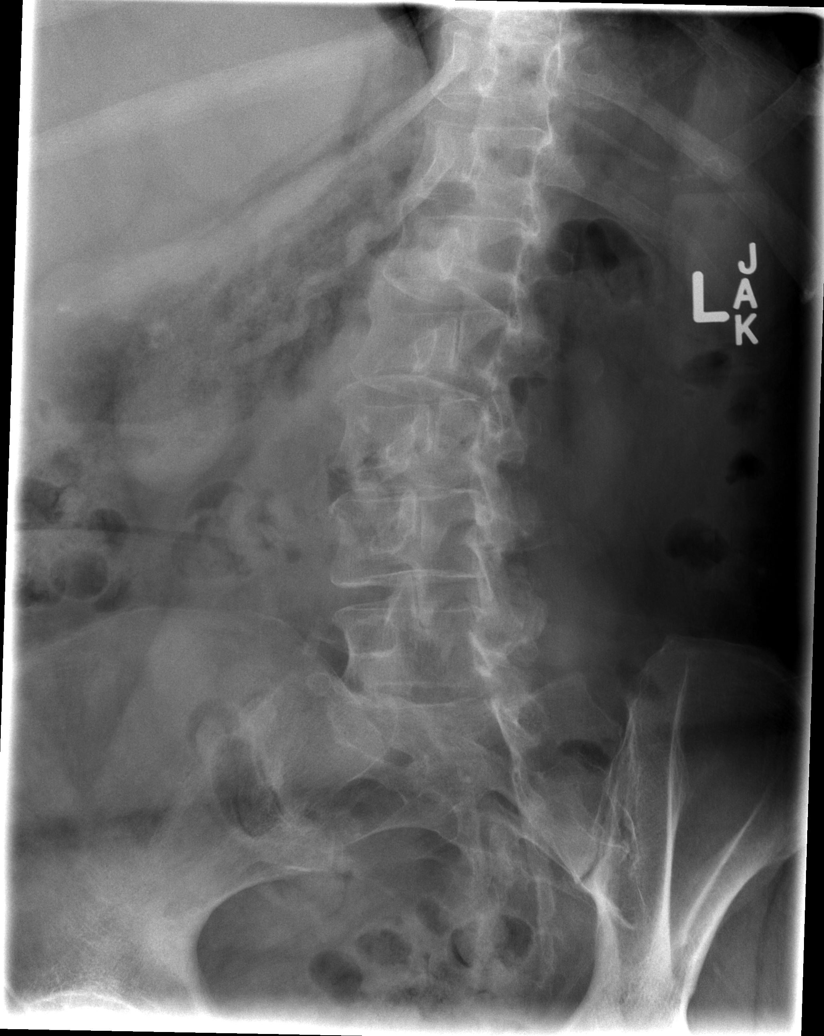

[t l-spine oblique exposure (2 of 2)]
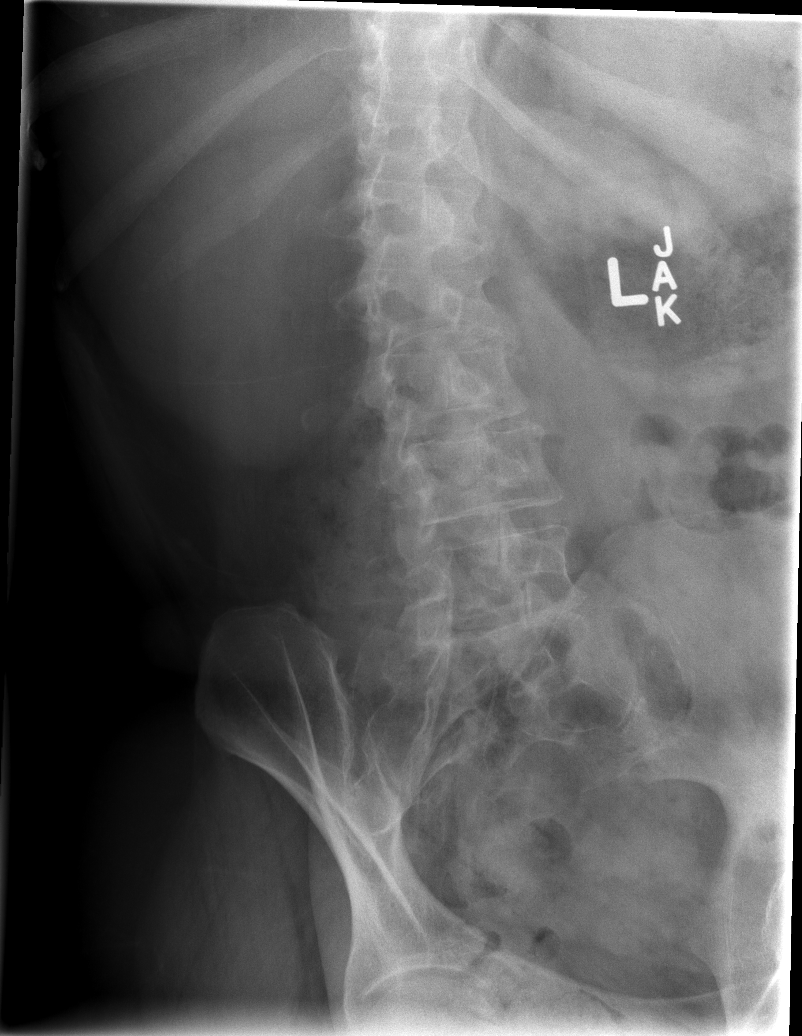

[t l-spine lat]
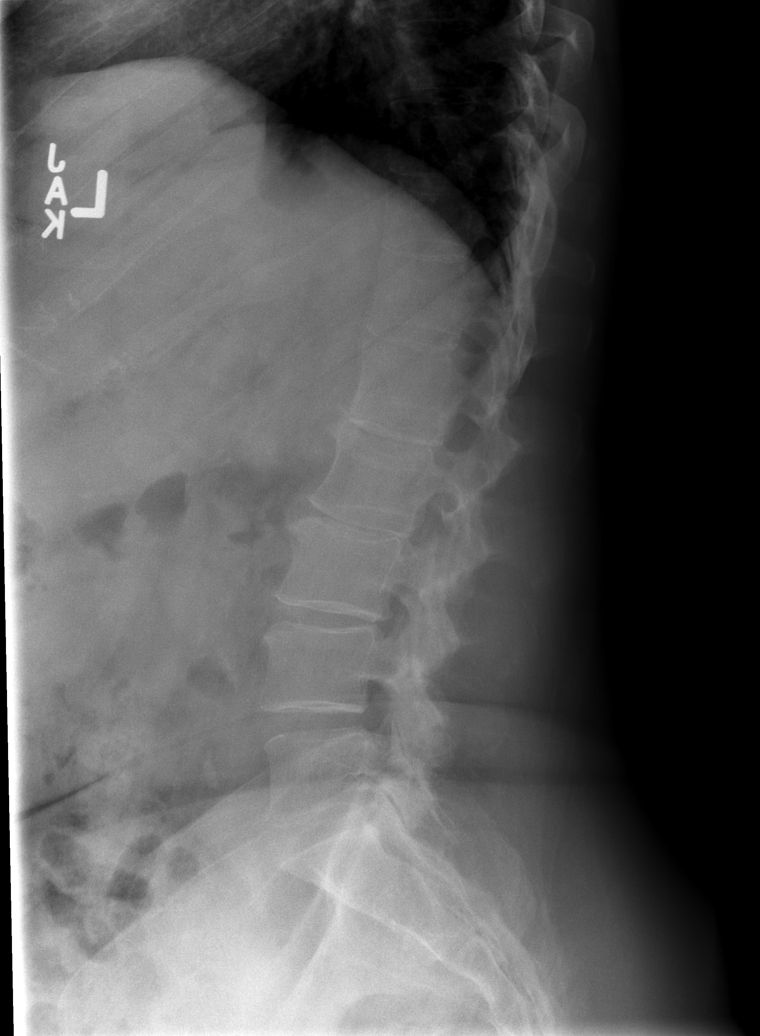

[t l-spine l5-s1 spot]
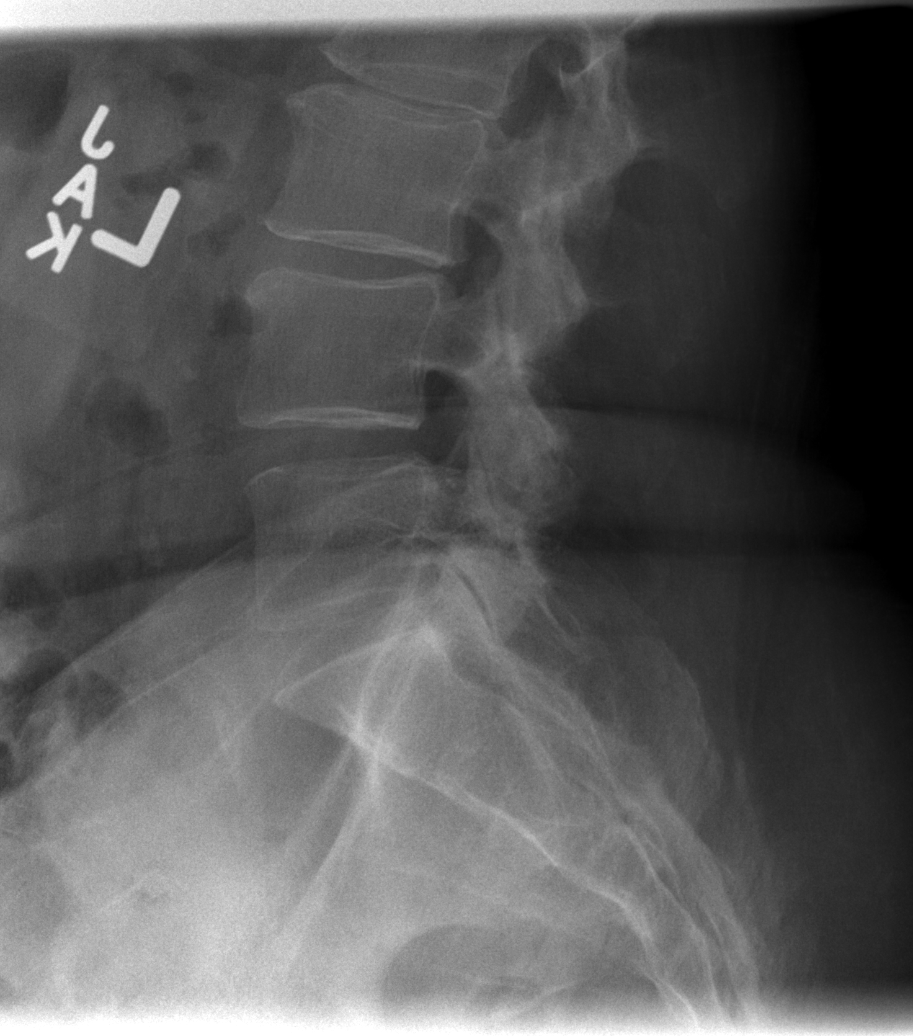

[5 of 5 positions shown; findings below may reference images not displayed]

FINDINGS: There is a mild rightward scoliosis centered in the mid lumbar
spine. Mild disc space narrowing diffusely throughout the lumbar
spine. Early anterior degenerative spurring at L1-2 and L2-3. Mild
degenerative facet disease in the lower lumbar spine. No fracture or
subluxation. SI joints are symmetric and unremarkable.
IMPRESSION: Mild degenerative disc and facet disease as above. Mild rightward
scoliosis. No acute findings.

## 2015-08-19 NOTE — Telephone Encounter (Signed)
Pt came into office to have blood work done.  There were no orders for blood work.  Blood was taken on Aug 8.  Robert didn't see any additional labs ordered. Pt was not happy she had driven all the way over here for nothing. She is requesting dr Emmaline Life to call her

## 2015-08-20 NOTE — Telephone Encounter (Signed)
Called patient and left a message as she was unavailable. Apologized regarding the confusion surrounding the blood work. I let her know that I have ordered the labs she will need, and she can come in at her earliest convenience next week to have these drawn.

## 2015-08-25 ENCOUNTER — Ambulatory Visit: Payer: Self-pay | Admitting: *Deleted

## 2015-08-31 ENCOUNTER — Other Ambulatory Visit: Payer: Medicare Other

## 2015-08-31 DIAGNOSIS — I10 Essential (primary) hypertension: Secondary | ICD-10-CM

## 2015-08-31 LAB — BASIC METABOLIC PANEL WITH GFR
BUN: 10 mg/dL (ref 7–25)
CALCIUM: 9.8 mg/dL (ref 8.6–10.4)
CO2: 23 mmol/L (ref 20–31)
Chloride: 102 mmol/L (ref 98–110)
Creat: 0.66 mg/dL (ref 0.50–1.05)
Glucose, Bld: 149 mg/dL — ABNORMAL HIGH (ref 65–99)
POTASSIUM: 4.2 mmol/L (ref 3.5–5.3)
SODIUM: 137 mmol/L (ref 135–146)

## 2015-09-10 ENCOUNTER — Encounter: Payer: Self-pay | Admitting: Internal Medicine

## 2015-09-21 ENCOUNTER — Ambulatory Visit: Payer: Self-pay | Admitting: *Deleted

## 2015-10-14 ENCOUNTER — Encounter: Payer: Self-pay | Admitting: Internal Medicine

## 2015-10-14 ENCOUNTER — Ambulatory Visit (INDEPENDENT_AMBULATORY_CARE_PROVIDER_SITE_OTHER): Payer: Medicare Other | Admitting: Internal Medicine

## 2015-10-14 VITALS — BP 128/77 | HR 73 | Temp 98.4°F | Wt 203.0 lb

## 2015-10-14 DIAGNOSIS — K0500 Acute gingivitis, plaque induced: Secondary | ICD-10-CM | POA: Diagnosis not present

## 2015-10-14 DIAGNOSIS — K0889 Other specified disorders of teeth and supporting structures: Secondary | ICD-10-CM | POA: Diagnosis not present

## 2015-10-14 MED ORDER — PENICILLIN V POTASSIUM 250 MG PO TABS: 250.0000 mg | ORAL_TABLET | Freq: Four times a day (QID) | ORAL | 0 refills | Status: DC

## 2015-10-14 MED ORDER — HYDROCODONE-ACETAMINOPHEN 5-325 MG PO TABS: 1.0000 | ORAL_TABLET | Freq: Four times a day (QID) | ORAL | 0 refills | Status: DC | PRN

## 2015-10-14 MED ORDER — CLINDAMYCIN HCL 150 MG PO CAPS: 450.0000 mg | ORAL_CAPSULE | Freq: Three times a day (TID) | ORAL | 0 refills | Status: DC

## 2015-10-14 MED ORDER — METRONIDAZOLE 500 MG PO TABS: 500.0000 mg | ORAL_TABLET | Freq: Two times a day (BID) | ORAL | 0 refills | Status: DC

## 2015-10-14 NOTE — Patient Instructions (Addendum)
Ms. Amble,   I have prescribed clindamycin 450 mg to take 3 times daily for 10 days.   For pain, I have prescribed norco to take up to every 6 hours.  Best, Dr. Ola Spurr

## 2015-10-14 NOTE — Progress Notes (Signed)
[facility] Progress Note  Subjective:  Marie [name] is a 60 y.o. female who presents with dental pain. States she has a cracked lower molar on the R and has applied for dental insurance to establish with a dentist, but pain has become severe over the last 2 days. Says she feels that her fillings are coming out. She is able to drink liquids but has trouble chewing solids due to pain. She says she presented similarly towards the end of last year, and symptoms improved with antibiotics and pain medications (was prescribed keflex and norco at that time, per record review). She has an abscess of lower gums that chronically drains pus and had been opened up by a dentist previously, which is not causing her pain.  ROS: No fevers, no n/v/d, no trismus, no sore throat   Past Medical History:  Diagnosis Date  . Anxiety   . COPD (chronic obstructive pulmonary disease) ([place]) 2011  . Depression 2008  . Diabetes mellitus without complication ([place])   . GERD (gastroesophageal reflux disease) 1994  . Hypertension   . Kidney stone   . Migraine 2008  . PTSD (post-traumatic stress disorder)     Social History   Social History  . Marital status: Divorced    Spouse name: N/A  . Number of children: N/A  . Years of education: N/A   Occupational History  . Not on file.   Social History Main Topics  . Smoking status: Current Every Day Smoker    Packs/day: 1.00    Types: Cigarettes  . Smokeless tobacco: Never Used  . Alcohol use No  . Drug use: No  . Sexual activity: Not on file   Other Topics Concern  . Not on file   Social History Narrative  . No narrative on file    Allergies  Allergen Reactions  . Statins Other (See Comments)    Reaction: lipitor, zocor - muscle pain, ache  . Amoxicillin Diarrhea and Other (See Comments)    Has patient had a PCN reaction causing immediate rash, facial/tongue/throat swelling, SOB or lightheadedness with hypotension: No Has patient  had a PCN reaction causing severe rash involving mucus membranes or skin necrosis: No Has patient had a PCN reaction that required hospitalization No     Upset stomach Has patient had a PCN reaction occurring within the last 10 years: No If all of the above answers are "NO", then may proceed with Cephalosporin use.   . Fibrates Other (See Comments)    Cramps  . Sulfa Antibiotics Other (See Comments)    Reaction: unknown, but not hypersensitivity    Objective: Blood pressure 128/77, pulse 73, temperature 98.4 F (36.9 C), temperature source Oral, weight 203 lb (92.1 kg). Body mass index is 34.84 kg/m. Constitutional: Obese female, in NAD HENT: Over-riding swollen gums around R lower molars, no TTP touching teeth with q-tip. Normal posterior oropharynx. Able to open mouth fully. No drooling. Face symmetric.  Lymph: TTP over R cervical and submandibular regions without palpable lymphadenopathy.  Cardiovascular: RRR, S1, S2, no m/r/g.  Neurological: Speech normal.  Skin: Skin is warm and dry. No rash noted. No erythema.  Vitals reviewed  Assessment/Plan: Acute gingivitis - Was going to prescribe penicillin and metronidazole but patient did not want to take any penicillin antibiotic, as she has had a reaction in the past. In her record, reaction is listed as GI upset, but she is concerned reaction had been more than that but could not recall. -  Prescribed clindamycin 450 mg TID x 10 days and 7-day supply of norco 5-325 #28 pills.   Follow-up if continues to have pain once course of antibiotics completed.  Olene Floss, MD [place], PGY-2

## 2015-10-17 DIAGNOSIS — K0500 Acute gingivitis, plaque induced: Secondary | ICD-10-CM | POA: Insufficient documentation

## 2015-10-17 NOTE — Assessment & Plan Note (Addendum)
-   Was going to prescribe penicillin and metronidazole but patient did not want to take any penicillin antibiotic, as she has had a reaction in the past. In her record, reaction is listed as GI upset, but she is concerned reaction had been more than that but could not recall. - Prescribed clindamycin 450 mg TID x 10 days and 7-day supply of norco 5-325 #28 pills.

## 2015-11-17 ENCOUNTER — Ambulatory Visit (INDEPENDENT_AMBULATORY_CARE_PROVIDER_SITE_OTHER): Payer: Medicare Other | Admitting: *Deleted

## 2015-11-17 DIAGNOSIS — Z23 Encounter for immunization: Secondary | ICD-10-CM

## 2015-12-09 ENCOUNTER — Encounter: Payer: Self-pay | Admitting: *Deleted

## 2015-12-09 ENCOUNTER — Ambulatory Visit (INDEPENDENT_AMBULATORY_CARE_PROVIDER_SITE_OTHER): Payer: Medicare Other | Admitting: *Deleted

## 2015-12-09 VITALS — BP 144/68 | HR 78 | Temp 98.0°F | Ht 64.0 in | Wt 207.2 lb

## 2015-12-09 DIAGNOSIS — Z0000 Encounter for general adult medical examination without abnormal findings: Secondary | ICD-10-CM

## 2015-12-09 DIAGNOSIS — Z114 Encounter for screening for human immunodeficiency virus [HIV]: Secondary | ICD-10-CM

## 2015-12-09 NOTE — Progress Notes (Signed)
Subjective:   Marie [name] is a 60 y.o. female who presents for an Initial Medicare Annual Wellness Visit.   Cardiac Risk Factors include: diabetes mellitus;hypertension;sedentary lifestyle;smoking/ tobacco exposure;obesity (BMI >30kg/m2)     Objective:    Today's Vitals   12/09/15 1350  BP: (!) 144/68  Pulse: 78  Temp: 98 F (36.7 C)  TempSrc: Oral  SpO2: 96%  Weight: 207 lb 3.2 oz (94 kg)  Height: '5\' 4"'  (1.626 m)   Body mass index is 35.57 kg/m.   Current Medications (verified) Outpatient Encounter Prescriptions as of 12/09/2015  Medication Sig  . atenolol (TENORMIN) 50 MG tablet Take 1 tablet (50 mg total) by mouth 2 (two) times daily.  . Blood Glucose Monitoring Suppl (ONE TOUCH ULTRA 2) W/DEVICE KIT 1 Device by Does not apply route daily.  . cholecalciferol (VITAMIN D) 400 units TABS tablet Take 400 Units by mouth.  . co-enzyme Q-10 30 MG capsule Take 30 mg by mouth 3 (three) times daily.  . Collagen-Vitamin C (COLLAGEN PLUS VITAMIN C PO) Take 1,000 mg by mouth.  Marland Kitchen DHEA 50 MG CAPS Take by mouth.  Marland Kitchen glucose blood (ONE TOUCH ULTRA TEST) test strip Use as instructed  . Lancets (ONETOUCH ULTRASOFT) lancets Use as instructed  . lisinopril (ZESTRIL) 2.5 MG tablet Take 1 tablet (2.5 mg total) by mouth daily.  . metFORMIN (GLUCOPHAGE) 500 MG tablet Take 1 tablet (500 mg total) by mouth 2 (two) times daily with a meal.  . Multiple Vitamin (MULTIVITAMIN) capsule Take 1 capsule by mouth daily.  . Omega-3 Fatty Acids (FISH OIL PO) Take 1 capsule by mouth daily.  Marland Kitchen omeprazole (PRILOSEC) 20 MG capsule Take 1 capsule (20 mg total) by mouth daily.  . Potassium 99 MG TABS Take by mouth.  . topiramate (TOPAMAX) 100 MG tablet Take 1 tablet (100 mg total) by mouth 2 (two) times daily.  Marland Kitchen venlafaxine XR (EFFEXOR-XR) 75 MG 24 hr capsule Take 3 capsules (225 mg total) by mouth daily with breakfast.  . vitamin E 200 UNIT capsule Take 200 Units by mouth daily.  Marland Kitchen zinc gluconate  50 MG tablet Take 50 mg by mouth daily.  . tamsulosin (FLOMAX) 0.4 MG CAPS capsule Take 1 capsule (0.4 mg total) by mouth daily. (Patient not taking: Reported on 12/09/2015)  . [DISCONTINUED] clindamycin (CLEOCIN) 150 MG capsule Take 3 capsules (450 mg total) by mouth 3 (three) times daily. (Patient not taking: Reported on 12/09/2015)  . [DISCONTINUED] HYDROcodone-acetaminophen (NORCO) 5-325 MG tablet Take 1 tablet by mouth every 6 (six) hours as needed for moderate pain. (Patient not taking: Reported on 12/09/2015)   No facility-administered encounter medications on file as of 12/09/2015.     Allergies (verified) Statins; Amoxicillin; Fibrates; and Sulfa antibiotics   History: Past Medical History:  Diagnosis Date  . Anxiety   . COPD (chronic obstructive pulmonary disease) ([place]) 2011  . Depression 2008  . Diabetes mellitus without complication ([place])   . GERD (gastroesophageal reflux disease) 1994  . Hypertension   . Kidney stone   . Migraine 2008  . PTSD (post-traumatic stress disorder)    Past Surgical History:  Procedure Laterality Date  . APPENDECTOMY  1975  . BREAST CYST EXCISION Bilateral   . CARPAL TUNNEL RELEASE    . CESAREAN SECTION  1994  . KNEE ARTHROSCOPY Right   . KNEE SURGERY Right 2012   meniscal tear repair  . TONSILLECTOMY     Family History  Problem Relation Age  of Onset  . Colon cancer Mother 42  . Hyperlipidemia Mother   . Cancer Sister     breast  . Diabetes Sister   . Hyperlipidemia Sister   . Bladder Cancer Father    Social History   Occupational History  . Not on file.   Social History Main Topics  . Smoking status: Current Every Day Smoker    Packs/day: 1.00    Types: Cigarettes  . Smokeless tobacco: Never Used     Comment: Sometimes a half pack per day  . Alcohol use Yes     Comment: on ocassion has "liquor"  . Drug use: No  . Sexual activity: No    Tobacco Counseling Ready to quit: No Counseling given: Yes Information given on  1-800-QUIT-NOW  Activities of Daily Living In your present state of health, do you have any difficulty performing the following activities: 12/09/2015  Hearing? N  Vision? N  Difficulty concentrating or making decisions? Y  Walking or climbing stairs? Y  Dressing or bathing? Y  Doing errands, shopping? N  Preparing Food and eating ? N  Using the Toilet? N  In the past six months, have you accidently leaked urine? N  Do you have problems with loss of bowel control? N  Managing your Medications? N  Managing your Finances? N  Housekeeping or managing your Housekeeping? Y  Some recent data might be hidden  Home Safety:  My home has a working smoke alarm:  Yes X 2           My home throw rugs have been fastened down to the floor or removed:  Fastened down I have non-slip mats in the bathtub and shower:  No, but has grab bar         All my home's stairs have railings or bannisters: Mobile home with 3 outside steps with railing         My home's floors, stairs and hallways are free from clutter, wires and cords:  Yes     I wear seatbelts consistently:  Yes    Immunizations and Health Maintenance Immunization History  Administered Date(s) Administered  . Hepatitis B 03/30/2014, 06/12/2014, 09/30/2014  . Influenza,inj,Quad PF,36+ Mos 10/18/2012, 11/06/2013, 11/03/2014, 11/17/2015  . Pneumococcal Polysaccharide-23 11/03/2014   Health Maintenance Due  Topic Date Due  . OPHTHALMOLOGY EXAM  03/11/1967  . HIV Screening  03/10/1972  HIV drawn today Patient will schedule appt for eye exam. List of doctors who take Medicare given to patient.   Patient Care Team: Asiyah Cletis Media, MD as PCP - General (Family Medicine)  Indicate any recent Medical Services you may have received from other than [facility] providers in the past year (date may be approximate).     Assessment:   This is a routine wellness examination for [facility].   Hearing/Vision screen  Hearing Screening   Method:  Audiometry   '125Hz'  '250Hz'  '500Hz'  '1000Hz'  '2000Hz'  '3000Hz'  '4000Hz'  '6000Hz'  '8000Hz'   Right ear:   '25 25 25  ' 40    Left ear:   '25 25 25  25      ' Dietary issues and exercise activities discussed: Current Exercise Habits: The patient does not participate in regular exercise at present ( Has Silve Sneakers), Exercise limited by: psychological condition(s) (PTSD and lack of motivation)  Goals    . Blood Pressure < 140/90    . Quit smoking / using tobacco          Smoking less. Instead of smoking whole  pack/day smoke .5-.75 pack/day     . Weight (lb) < 196 lb (88.9 kg) (pt-stated)          5% weight loss     Discussed recording consumption intake to assist with desired 5% weight loss. Recommended MyPLate or My Fitness Pal apps to assist with recording. Discussed engaging in physical activity 150 min/week  Depression Screen PHQ 2/9 Scores 12/09/2015 01/08/2015 11/03/2014 09/29/2014 09/22/2014 08/10/2014 02/26/2014  PHQ - 2 Score 4 0 '1 1 1 ' 0 0  PHQ- 9 Score 9 - - - - - -  Behavioral Health contact info given  Fall Risk Fall Risk  12/09/2015 01/08/2015 12/07/2014 09/29/2014 09/22/2014  Falls in the past year? No No No No No   TUG Test:  Done in 9 seconds. Patient used both hands to push out of chair and to sit back down.  Cognitive Function: Mini-Cog  Passed with score 3/5  Screening Tests Health Maintenance  Topic Date Due  . OPHTHALMOLOGY EXAM  03/11/1967  . HIV Screening  03/10/1972  . HEMOGLOBIN A1C  02/10/2016  . PAP SMEAR  05/19/2016  . FOOT EXAM  08/09/2016  . MAMMOGRAM  05/03/2017  . PNEUMOCOCCAL POLYSACCHARIDE VACCINE (2) 11/03/2019  . TETANUS/TDAP  10/17/2020  . COLONOSCOPY  05/19/2024  . INFLUENZA VACCINE  Completed  . Hepatitis C Screening  Completed      Plan:     During the course of the visit, Fia was educated and counseled about the following appropriate screening and preventive services:   Vaccines to include Pneumoccal, Influenza, Td, Zostavax  Cardiovascular  disease screening  Colorectal cancer screening  Bone density screening  Diabetes screening  Glaucoma screening  Mammography/PAP  Nutrition counseling  Smoking cessation counseling  Patient Instructions (the written plan) were given to the patient.    Velora Heckler, RN   12/09/2015

## 2015-12-09 NOTE — Patient Instructions (Signed)
Fat and Cholesterol Restricted Diet Introduction Getting too much fat and cholesterol in your diet may cause health problems. Following this diet helps keep your fat and cholesterol at normal levels. This can keep you from getting sick. What types of fat should I choose?  Choose monosaturated and polyunsaturated fats. These are found in foods such as olive oil, canola oil, flaxseeds, walnuts, almonds, and seeds.  Eat more omega-3 fats. Good choices include salmon, mackerel, sardines, tuna, flaxseed oil, and ground flaxseeds.  Limit saturated fats. These are in animal products such as meats, butter, and cream. They can also be in plant products such as palm oil, palm kernel oil, and coconut oil.  Avoid foods with partially hydrogenated oils in them. These contain trans fats. Examples of foods that have trans fats are stick margarine, some tub margarines, cookies, crackers, and other baked goods. What general guidelines do I need to follow?  Check food labels. Look for the words "trans fat" and "saturated fat."  When preparing a meal:  Fill half of your plate with vegetables and green salads.  Fill one fourth of your plate with whole grains. Look for the word "whole" as the first word in the ingredient list.  Fill one fourth of your plate with lean protein foods.  Eat more foods that have fiber, like apples, carrots, beans, peas, and barley.  Eat more home-cooked foods. Eat less at restaurants and buffets.  Limit or avoid alcohol.  Limit foods high in starch and sugar.  Limit fried foods.  Cook foods without frying them. Baking, boiling, grilling, and broiling are all great options.  Lose weight if you are overweight. Losing even a small amount of weight can help your overall health. It can also help prevent diseases such as diabetes and heart disease. What foods can I eat? Grains  Whole grains, such as whole wheat or whole grain breads, crackers, cereals, and pasta.  Unsweetened oatmeal, bulgur, barley, quinoa, or brown rice. Corn or whole wheat flour tortillas. Vegetables  Fresh or frozen vegetables (raw, steamed, roasted, or grilled). Green salads. Fruits  All fresh, canned (in natural juice), or frozen fruits. Meat and Other Protein Products  Ground beef (85% or leaner), grass-fed beef, or beef trimmed of fat. Skinless chicken or [place]. Ground chicken or [place]. Pork trimmed of fat. All fish and seafood. Eggs. Dried beans, peas, or lentils. Unsalted nuts or seeds. Unsalted canned or dry beans. Dairy  Low-fat dairy products, such as skim or 1% milk, 2% or reduced-fat cheeses, low-fat ricotta or cottage cheese, or plain low-fat yogurt. Fats and Oils  Tub margarines without trans fats. Light or reduced-fat mayonnaise and salad dressings. Avocado. Olive, canola, sesame, or safflower oils. Natural peanut or almond butter (choose ones without added sugar and oil). The items listed above may not be a complete list of recommended foods or beverages. Contact your dietitian for more options.  What foods are not recommended? Grains  White bread. White pasta. White rice. Cornbread. Bagels, pastries, and croissants. Crackers that contain trans fat. Vegetables  White potatoes. Corn. Creamed or fried vegetables. Vegetables in a cheese sauce. Fruits  Dried fruits. Canned fruit in light or heavy syrup. Fruit juice. Meat and Other Protein Products  Fatty cuts of meat. Ribs, chicken wings, bacon, sausage, bologna, salami, chitterlings, fatback, hot dogs, bratwurst, and packaged luncheon meats. Liver and organ meats. Dairy  Whole or 2% milk, cream, half-and-half, and cream cheese. Whole milk cheeses. Whole-fat or sweetened yogurt. Full-fat cheeses. Nondairy creamers  and whipped toppings. Processed cheese, cheese spreads, or cheese curds. Sweets and Desserts  Corn syrup, sugars, honey, and molasses. Candy. Jam and jelly. Syrup. Sweetened cereals. Cookies, pies, cakes,  donuts, muffins, and ice cream. Fats and Oils  Butter, stick margarine, lard, shortening, ghee, or bacon fat. Coconut, palm kernel, or palm oils. Beverages  Alcohol. Sweetened drinks (such as sodas, lemonade, and fruit drinks or punches). The items listed above may not be a complete list of foods and beverages to avoid. Contact your dietitian for more information.  This information is not intended to replace advice given to you by your health care provider. Make sure you discuss any questions you have with your health care provider. Document Released: 06/20/2011 Document Revised: 08/26/2015 Document Reviewed: 03/20/2013  2017 Elsevier   Diabetes and Foot Care Diabetes may cause you to have problems because of poor blood supply (circulation) to your feet and legs. This may cause the skin on your feet to become thinner, break easier, and heal more slowly. Your skin may become dry, and the skin may peel and crack. You may also have nerve damage in your legs and feet causing decreased feeling in them. You may not notice minor injuries to your feet that could lead to infections or more serious problems. Taking care of your feet is one of the most important things you can do for yourself. Follow these instructions at home:  Wear shoes at all times, even in the house. Do not go barefoot. Bare feet are easily injured.  Check your feet daily for blisters, cuts, and redness. If you cannot see the bottom of your feet, use a mirror or ask someone for help.  Wash your feet with warm water (do not use hot water) and mild soap. Then pat your feet and the areas between your toes until they are completely dry. Do not soak your feet as this can dry your skin.  Apply a moisturizing lotion or petroleum jelly (that does not contain alcohol and is unscented) to the skin on your feet and to dry, brittle toenails. Do not apply lotion between your toes.  Trim your toenails straight across. Do not dig under them or  around the cuticle. File the edges of your nails with an emery board or nail file.  Do not cut corns or calluses or try to remove them with medicine.  Wear clean socks or stockings every day. Make sure they are not too tight. Do not wear knee-high stockings since they may decrease blood flow to your legs.  Wear shoes that fit properly and have enough cushioning. To break in new shoes, wear them for just a few hours a day. This prevents you from injuring your feet. Always look in your shoes before you put them on to be sure there are no objects inside.  Do not cross your legs. This may decrease the blood flow to your feet.  If you find a minor scrape, cut, or break in the skin on your feet, keep it and the skin around it clean and dry. These areas may be cleansed with mild soap and water. Do not cleanse the area with peroxide, alcohol, or iodine.  When you remove an adhesive bandage, be sure not to damage the skin around it.  If you have a wound, look at it several times a day to make sure it is healing.  Do not use heating pads or hot water bottles. They may burn your skin. If you have lost feeling  in your feet or legs, you may not know it is happening until it is too late.  Make sure your health care provider performs a complete foot exam at least annually or more often if you have foot problems. Report any cuts, sores, or bruises to your health care provider immediately. Contact a health care provider if:  You have an injury that is not healing.  You have cuts or breaks in the skin.  You have an ingrown nail.  You notice redness on your legs or feet.  You feel burning or tingling in your legs or feet.  You have pain or cramps in your legs and feet.  Your legs or feet are numb.  Your feet always feel cold. Get help right away if:  There is increasing redness, swelling, or pain in or around a wound.  There is a red line that goes up your leg.  Pus is coming from a  wound.  You develop a fever or as directed by your health care provider.  You notice a bad smell coming from an ulcer or wound. This information is not intended to replace advice given to you by your health care provider. Make sure you discuss any questions you have with your health care provider. Document Released: 12/17/1999 Document Revised: 05/27/2015 Document Reviewed: 05/28/2012 Elsevier Interactive Patient Education  2017 [place] Prevention in the Home Introduction Falls can cause injuries. They can happen to people of all ages. There are many things you can do to make your home safe and to help prevent falls. What can I do on the outside of my home?  Regularly fix the edges of walkways and driveways and fix any cracks.  Remove anything that might make you trip as you walk through a door, such as a raised step or threshold.  Trim any bushes or trees on the path to your home.  Use bright outdoor lighting.  Clear any walking paths of anything that might make someone trip, such as rocks or tools.  Regularly check to see if handrails are loose or broken. Make sure that both sides of any steps have handrails.  Any raised decks and porches should have guardrails on the edges.  Have any leaves, snow, or ice cleared regularly.  Use sand or salt on walking paths during winter.  Clean up any spills in your garage right away. This includes oil or grease spills. What can I do in the bathroom?  Use night lights.  Install grab bars by the toilet and in the tub and shower. Do not use towel bars as grab bars.  Use non-skid mats or decals in the tub or shower.  If you need to sit down in the shower, use a plastic, non-slip stool.  Keep the floor dry. Clean up any water that spills on the floor as soon as it happens.  Remove soap buildup in the tub or shower regularly.  Attach bath mats securely with double-sided non-slip rug tape.  Do not have throw rugs and  other things on the floor that can make you trip. What can I do in the bedroom?  Use night lights.  Make sure that you have a light by your bed that is easy to reach.  Do not use any sheets or blankets that are too big for your bed. They should not hang down onto the floor.  Have a firm chair that has side arms. You can use this for support while you get  dressed.  Do not have throw rugs and other things on the floor that can make you trip. What can I do in the kitchen?  Clean up any spills right away.  Avoid walking on wet floors.  Keep items that you use a lot in easy-to-reach places.  If you need to reach something above you, use a strong step stool that has a grab bar.  Keep electrical cords out of the way.  Do not use floor polish or wax that makes floors slippery. If you must use wax, use non-skid floor wax.  Do not have throw rugs and other things on the floor that can make you trip. What can I do with my stairs?  Do not leave any items on the stairs.  Make sure that there are handrails on both sides of the stairs and use them. Fix handrails that are broken or loose. Make sure that handrails are as long as the stairways.  Check any carpeting to make sure that it is firmly attached to the stairs. Fix any carpet that is loose or worn.  Avoid having throw rugs at the top or bottom of the stairs. If you do have throw rugs, attach them to the floor with carpet tape.  Make sure that you have a light switch at the top of the stairs and the bottom of the stairs. If you do not have them, ask someone to add them for you. What else can I do to help prevent falls?  Wear shoes that:  Do not have high heels.  Have rubber bottoms.  Are comfortable and fit you well.  Are closed at the toe. Do not wear sandals.  If you use a stepladder:  Make sure that it is fully opened. Do not climb a closed stepladder.  Make sure that both sides of the stepladder are locked into  place.  Ask someone to hold it for you, if possible.  Clearly mark and make sure that you can see:  Any grab bars or handrails.  First and last steps.  Where the edge of each step is.  Use tools that help you move around (mobility aids) if they are needed. These include:  Canes.  Walkers.  Scooters.  Crutches.  Turn on the lights when you go into a dark area. Replace any light bulbs as soon as they burn out.  Set up your furniture so you have a clear path. Avoid moving your furniture around.  If any of your floors are uneven, fix them.  If there are any pets around you, be aware of where they are.  Review your medicines with your doctor. Some medicines can make you feel dizzy. This can increase your chance of falling. Ask your doctor what other things that you can do to help prevent falls. This information is not intended to replace advice given to you by your health care provider. Make sure you discuss any questions you have with your health care provider. Document Released: 10/15/2008 Document Revised: 05/27/2015 Document Reviewed: 01/23/2014  2017 Elsevier  Health Maintenance, Female Introduction Adopting a healthy lifestyle and getting preventive care can go a long way to promote health and wellness. Talk with your health care provider about what schedule of regular examinations is right for you. This is a good chance for you to check in with your provider about disease prevention and staying healthy. In between checkups, there are plenty of things you can do on your own. Experts have done a lot of  research about which lifestyle changes and preventive measures are most likely to keep you healthy. Ask your health care provider for more information. Weight and diet Eat a healthy diet  Be sure to include plenty of vegetables, fruits, low-fat dairy products, and lean protein.  Do not eat a lot of foods high in solid fats, added sugars, or salt.  Get regular exercise.  This is one of the most important things you can do for your health.  Most adults should exercise for at least 150 minutes each week. The exercise should increase your heart rate and make you sweat (moderate-intensity exercise).  Most adults should also do strengthening exercises at least twice a week. This is in addition to the moderate-intensity exercise. Maintain a healthy weight  Body mass index (BMI) is a measurement that can be used to identify possible weight problems. It estimates body fat based on height and weight. Your health care provider can help determine your BMI and help you achieve or maintain a healthy weight.  For females 49 years of age and older:  A BMI below 18.5 is considered underweight.  A BMI of 18.5 to 24.9 is normal.  A BMI of 25 to 29.9 is considered overweight.  A BMI of 30 and above is considered obese. Watch levels of cholesterol and blood lipids  You should start having your blood tested for lipids and cholesterol at 59 years of age, then have this test every 5 years.  You may need to have your cholesterol levels checked more often if:  Your lipid or cholesterol levels are high.  You are older than 58 years of age.  You are at high risk for heart disease. Cancer screening Lung Cancer  Lung cancer screening is recommended for adults 70-61 years old who are at high risk for lung cancer because of a history of smoking.  A yearly low-dose CT scan of the lungs is recommended for people who:  Currently smoke.  Have quit within the past 15 years.  Have at least a 30-pack-year history of smoking. A pack year is smoking an average of one pack of cigarettes a day for 1 year.  Yearly screening should continue until it has been 15 years since you quit.  Yearly screening should stop if you develop a health problem that would prevent you from having lung cancer treatment. Breast Cancer  Practice breast self-awareness. This means understanding how your  breasts normally appear and feel.  It also means doing regular breast self-exams. Let your health care provider know about any changes, no matter how small.  If you are in your 20s or 30s, you should have a clinical breast exam (CBE) by a health care provider every 1-3 years as part of a regular health exam.  If you are 50 or older, have a CBE every year. Also consider having a breast X-ray (mammogram) every year.  If you have a family history of breast cancer, talk to your health care provider about genetic screening.  If you are at high risk for breast cancer, talk to your health care provider about having an MRI and a mammogram every year.  Breast cancer gene (BRCA) assessment is recommended for women who have family members with BRCA-related cancers. BRCA-related cancers include:  Breast.  Ovarian.  Tubal.  Peritoneal cancers.  Results of the assessment will determine the need for genetic counseling and BRCA1 and BRCA2 testing. Cervical Cancer  Your health care provider may recommend that you be screened regularly for  cancer of the pelvic organs (ovaries, uterus, and vagina). This screening involves a pelvic examination, including checking for microscopic changes to the surface of your cervix (Pap test). You may be encouraged to have this screening done every 3 years, beginning at age 32.  For women ages 81-65, health care providers may recommend pelvic exams and Pap testing every 3 years, or they may recommend the Pap and pelvic exam, combined with testing for human papilloma virus (HPV), every 5 years. Some types of HPV increase your risk of cervical cancer. Testing for HPV may also be done on women of any age with unclear Pap test results.  Other health care providers may not recommend any screening for nonpregnant women who are considered low risk for pelvic cancer and who do not have symptoms. Ask your health care provider if a screening pelvic exam is right for you.  If you  have had past treatment for cervical cancer or a condition that could lead to cancer, you need Pap tests and screening for cancer for at least 20 years after your treatment. If Pap tests have been discontinued, your risk factors (such as having a new sexual partner) need to be reassessed to determine if screening should resume. Some women have medical problems that increase the chance of getting cervical cancer. In these cases, your health care provider may recommend more frequent screening and Pap tests. Colorectal Cancer  This type of cancer can be detected and often prevented.  Routine colorectal cancer screening usually begins at 58 years of age and continues through 58 years of age.  Your health care provider may recommend screening at an earlier age if you have risk factors for colon cancer.  Your health care provider may also recommend using home test kits to check for hidden blood in the stool.  A small camera at the end of a tube can be used to examine your colon directly (sigmoidoscopy or colonoscopy). This is done to check for the earliest forms of colorectal cancer.  Routine screening usually begins at age 68.  Direct examination of the colon should be repeated every 5-10 years through 58 years of age. However, you may need to be screened more often if early forms of precancerous polyps or small growths are found. Skin Cancer  Check your skin from head to toe regularly.  Tell your health care provider about any new moles or changes in moles, especially if there is a change in a mole's shape or color.  Also tell your health care provider if you have a mole that is larger than the size of a pencil eraser.  Always use sunscreen. Apply sunscreen liberally and repeatedly throughout the day.  Protect yourself by wearing long sleeves, pants, a wide-brimmed hat, and sunglasses whenever you are outside. Heart disease, diabetes, and high blood pressure  High blood pressure causes heart  disease and increases the risk of stroke. High blood pressure is more likely to develop in:  People who have blood pressure in the high end of the normal range (130-139/85-89 mm Hg).  People who are overweight or obese.  People who are African American.  If you are 24-51 years of age, have your blood pressure checked every 3-5 years. If you are 36 years of age or older, have your blood pressure checked every year. You should have your blood pressure measured twice-once when you are at a hospital or clinic, and once when you are not at a hospital or clinic. Record the average of  the two measurements. To check your blood pressure when you are not at a hospital or clinic, you can use:  An automated blood pressure machine at a pharmacy.  A home blood pressure monitor.  If you are between 34 years and 22 years old, ask your health care provider if you should take aspirin to prevent strokes.  Have regular diabetes screenings. This involves taking a blood sample to check your fasting blood sugar level.  If you are at a normal weight and have a low risk for diabetes, have this test once every three years after 58 years of age.  If you are overweight and have a high risk for diabetes, consider being tested at a younger age or more often. Preventing infection Hepatitis B  If you have a higher risk for hepatitis B, you should be screened for this virus. You are considered at high risk for hepatitis B if:  You were born in a country where hepatitis B is common. Ask your health care provider which countries are considered high risk.  Your parents were born in a high-risk country, and you have not been immunized against hepatitis B (hepatitis B vaccine).  You have HIV or AIDS.  You use needles to inject street drugs.  You live with someone who has hepatitis B.  You have had sex with someone who has hepatitis B.  You get hemodialysis treatment.  You take certain medicines for conditions,  including cancer, organ transplantation, and autoimmune conditions. Hepatitis C  Blood testing is recommended for:  Everyone born from [birth year] through 1963.  Anyone with known risk factors for hepatitis C. Sexually transmitted infections (STIs)  You should be screened for sexually transmitted infections (STIs) including gonorrhea and chlamydia if:  You are sexually active and are younger than 58 years of age.  You are older than 58 years of age and your health care provider tells you that you are at risk for this type of infection.  Your sexual activity has changed since you were last screened and you are at an increased risk for chlamydia or gonorrhea. Ask your health care provider if you are at risk.  If you do not have HIV, but are at risk, it may be recommended that you take a prescription medicine daily to prevent HIV infection. This is called pre-exposure prophylaxis (PrEP). You are considered at risk if:  You are sexually active and do not regularly use condoms or know the HIV status of your partner(s).  You take drugs by injection.  You are sexually active with a partner who has HIV. Talk with your health care provider about whether you are at high risk of being infected with HIV. If you choose to begin PrEP, you should first be tested for HIV. You should then be tested every 3 months for as long as you are taking PrEP. Pregnancy  If you are premenopausal and you may become pregnant, ask your health care provider about preconception counseling.  If you may become pregnant, take 400 to 800 micrograms (mcg) of folic acid every day.  If you want to prevent pregnancy, talk to your health care provider about birth control (contraception). Osteoporosis and menopause  Osteoporosis is a disease in which the bones lose minerals and strength with aging. This can result in serious bone fractures. Your risk for osteoporosis can be identified using a bone density scan.  If you are 59  years of age or older, or if you are at risk for osteoporosis and  fractures, ask your health care provider if you should be screened.  Ask your health care provider whether you should take a calcium or vitamin D supplement to lower your risk for osteoporosis.  Menopause may have certain physical symptoms and risks.  Hormone replacement therapy may reduce some of these symptoms and risks. Talk to your health care provider about whether hormone replacement therapy is right for you. Follow these instructions at home:  Schedule regular health, dental, and eye exams.  Stay current with your immunizations.  Do not use any tobacco products including cigarettes, chewing tobacco, or electronic cigarettes.  If you are pregnant, do not drink alcohol.  If you are breastfeeding, limit how much and how often you drink alcohol.  Limit alcohol intake to no more than 1 drink per day for nonpregnant women. One drink equals 12 ounces of beer, 5 ounces of wine, or 1 ounces of hard liquor.  Do not use street drugs.  Do not share needles.  Ask your health care provider for help if you need support or information about quitting drugs.  Tell your health care provider if you often feel depressed.  Tell your health care provider if you have ever been abused or do not feel safe at home. This information is not intended to replace advice given to you by your health care provider. Make sure you discuss any questions you have with your health care provider. Document Released: 07/04/2010 Document Revised: 05/27/2015 Document Reviewed: 09/22/2014  2017 Elsevier   Steps to Quit Smoking Smoking tobacco can be bad for your health. It can also affect almost every organ in your body. Smoking puts you and people around you at risk for many serious long-lasting (chronic) diseases. Quitting smoking is hard, but it is one of the best things that you can do for your health. It is never too late to quit. What are the  benefits of quitting smoking? When you quit smoking, you lower your risk for getting serious diseases and conditions. They can include:  Lung cancer or lung disease.  Heart disease.  Stroke.  Heart attack.  Not being able to have children (infertility).  Weak bones (osteoporosis) and broken bones (fractures). If you have coughing, wheezing, and shortness of breath, those symptoms may get better when you quit. You may also get sick less often. If you are pregnant, quitting smoking can help to lower your chances of having a baby of low birth weight. What can I do to help me quit smoking? Talk with your doctor about what can help you quit smoking. Some things you can do (strategies) include:  Quitting smoking totally, instead of slowly cutting back how much you smoke over a period of time.  Going to in-person counseling. You are more likely to quit if you go to many counseling sessions.  Using resources and support systems, such as:  Online chats with a Social worker.  Phone quitlines.  Printed Furniture conservator/restorer.  Support groups or group counseling.  Text messaging programs.  Mobile phone apps or applications.  Taking medicines. Some of these medicines may have nicotine in them. If you are pregnant or breastfeeding, do not take any medicines to quit smoking unless your doctor says it is okay. Talk with your doctor about counseling or other things that can help you. Talk with your doctor about using more than one strategy at the same time, such as taking medicines while you are also going to in-person counseling. This can help make quitting easier. What things  can I do to make it easier to quit? Quitting smoking might feel very hard at first, but there is a lot that you can do to make it easier. Take these steps:  Talk to your family and friends. Ask them to support and encourage you.  Call phone quitlines, reach out to support groups, or work with a Social worker.  Ask people who  smoke to not smoke around you.  Avoid places that make you want (trigger) to smoke, such as:  Bars.  Parties.  Smoke-break areas at work.  Spend time with people who do not smoke.  Lower the stress in your life. Stress can make you want to smoke. Try these things to help your stress:  Getting regular exercise.  Deep-breathing exercises.  Yoga.  Meditating.  Doing a body scan. To do this, close your eyes, focus on one area of your body at a time from head to toe, and notice which parts of your body are tense. Try to relax the muscles in those areas.  Download or buy apps on your mobile phone or tablet that can help you stick to your quit plan. There are many free apps, such as QuitGuide from the [employer] Office manager for Disease Control and Prevention). You can find more support from smokefree.gov and other websites. This information is not intended to replace advice given to you by your health care provider. Make sure you discuss any questions you have with your health care provider. Document Released: 10/15/2008 Document Revised: 08/17/2015 Document Reviewed: 05/05/2014 Elsevier Interactive Patient Education  2017 [employer].

## 2015-12-10 ENCOUNTER — Telehealth: Payer: Self-pay | Admitting: *Deleted

## 2015-12-10 LAB — HIV ANTIBODY (ROUTINE TESTING W REFLEX): HIV: NONREACTIVE

## 2015-12-10 NOTE — Telephone Encounter (Signed)
Attempted to reach patient to let her know I will be mailing her an Advanced Directive Booklet and to confirm address. Also, patient requested 90 day refills for Effexor, metformin and omeprazole. Effexor and metformin were written as 90 day with 3 refills each on 08/10/2015 so shouldn't need these refilled till August 2018. Refill request for 90 day supply of omeprazole sent to PCP yesterday.  Hubbard Hartshorn, RN, BSN

## 2015-12-13 ENCOUNTER — Other Ambulatory Visit: Payer: Self-pay | Admitting: Internal Medicine

## 2015-12-13 DIAGNOSIS — E119 Type 2 diabetes mellitus without complications: Secondary | ICD-10-CM

## 2015-12-13 MED ORDER — LISINOPRIL 2.5 MG PO TABS: 2.5000 mg | ORAL_TABLET | Freq: Every day | ORAL | 1 refills | Status: DC

## 2015-12-13 MED ORDER — OMEPRAZOLE 20 MG PO CPDR: 20.0000 mg | DELAYED_RELEASE_CAPSULE | Freq: Every day | ORAL | 3 refills | Status: DC

## 2015-12-13 NOTE — Telephone Encounter (Signed)
Pt needs a refill on her Lisinopril and Prilosec called in. jw

## 2015-12-14 NOTE — Telephone Encounter (Signed)
Verified patient address. Advanced Directive mailed to patient's home. Notified patient of refills as below.  Hubbard Hartshorn, RN, BSN

## 2015-12-16 NOTE — Progress Notes (Signed)
I have reviewed the well visit performed by Lyman Bishop and agree with the findings   Kerrin Mo, MD

## 2016-02-01 ENCOUNTER — Encounter: Payer: Self-pay | Admitting: Internal Medicine

## 2016-02-01 ENCOUNTER — Ambulatory Visit (INDEPENDENT_AMBULATORY_CARE_PROVIDER_SITE_OTHER): Payer: Medicare Other | Admitting: Internal Medicine

## 2016-02-01 ENCOUNTER — Other Ambulatory Visit: Payer: Self-pay | Admitting: Family Medicine

## 2016-02-01 DIAGNOSIS — I10 Essential (primary) hypertension: Secondary | ICD-10-CM | POA: Diagnosis not present

## 2016-02-01 DIAGNOSIS — E119 Type 2 diabetes mellitus without complications: Secondary | ICD-10-CM

## 2016-02-01 NOTE — Patient Instructions (Signed)
Please follow up to discuss memory loss. I think you are doing great with your weight loss   Mediterranean Diet A Mediterranean diet refers to food and lifestyle choices that are based on the traditions of countries located on the [employer]. This way of eating has been shown to help prevent certain conditions and improve outcomes for people who have chronic diseases, like kidney disease and heart disease. What are tips for following this plan? Lifestyle  Cook and eat meals together with your family, when possible.  Drink enough fluid to keep your urine clear or pale yellow.  Be physically active every day. This includes:  Aerobic exercise like running or swimming.  Leisure activities like gardening, walking, or housework.  Get 7-8 hours of sleep each night.  If recommended by your health care provider, drink red wine in moderation. This means 1 glass a day for nonpregnant women and 2 glasses a day for men. A glass of wine equals 5 oz (150 mL). Reading food labels  Check the serving size of packaged foods. For foods such as rice and pasta, the serving size refers to the amount of cooked product, not dry.  Check the total fat in packaged foods. Avoid foods that have saturated fat or trans fats.  Check the ingredients list for added sugars, such as corn syrup. Shopping  At the grocery store, buy most of your food from the areas near the walls of the store. This includes:  Fresh fruits and vegetables (produce).  Grains, beans, nuts, and seeds. Some of these may be available in unpackaged forms or large amounts (in bulk).  Fresh seafood.  Poultry and eggs.  Low-fat dairy products.  Buy whole ingredients instead of prepackaged foods.  Buy fresh fruits and vegetables in-season from local farmers markets.  Buy frozen fruits and vegetables in resealable bags.  If you do not have access to quality fresh seafood, buy precooked frozen shrimp or canned fish, such as tuna,  salmon, or sardines.  Buy small amounts of raw or cooked vegetables, salads, or olives from the deli or salad bar at your store.  Stock your pantry so you always have certain foods on hand, such as olive oil, canned tuna, canned tomatoes, rice, pasta, and beans. Cooking  Cook foods with extra-virgin olive oil instead of using butter or other vegetable oils.  Have meat as a side dish, and have vegetables or grains as your main dish. This means having meat in small portions or adding small amounts of meat to foods like pasta or stew.  Use beans or vegetables instead of meat in common dishes like chili or lasagna.  Experiment with different cooking methods. Try roasting or broiling vegetables instead of steaming or sauteing them.  Add frozen vegetables to soups, stews, pasta, or rice.  Add nuts or seeds for added healthy fat at each meal. You can add these to yogurt, salads, or vegetable dishes.  Marinate fish or vegetables using olive oil, lemon juice, garlic, and fresh herbs. Meal planning  Plan to eat 1 vegetarian meal one day each week. Try to work up to 2 vegetarian meals, if possible.  Eat seafood 2 or more times a week.  Have healthy snacks readily available, such as:  Vegetable sticks with hummus.  Greek yogurt.  Fruit and nut trail mix.  Eat balanced meals throughout the week. This includes:  Fruit: 2-3 servings a day  Vegetables: 4-5 servings a day  Low-fat dairy: 2 servings a day  Fish, poultry,  or lean meat: 1 serving a day  Beans and legumes: 2 or more servings a week  Nuts and seeds: 1-2 servings a day  Whole grains: 6-8 servings a day  Extra-virgin olive oil: 3-4 servings a day  Limit red meat and sweets to only a few servings a month What are my food choices?  Mediterranean diet  Recommended  Grains: Whole-grain pasta. Brown rice. Bulgar wheat. Polenta. Couscous. Whole-wheat bread. [place].  Vegetables: Artichokes. Beets. Broccoli.  Cabbage. Carrots. Eggplant. Green beans. Chard. Kale. Spinach. Onions. Leeks. Peas. Squash. Tomatoes. Peppers. Radishes.  Fruits: Apples. Apricots. Avocado. Berries. Bananas. Cherries. Dates. Figs. Grapes. Lemons. Melon. Oranges. Peaches. Plums. Pomegranate.  Meats and other protein foods: Beans. Almonds. Sunflower seeds. Pine nuts. Peanuts. [place]. Salmon. Scallops. Shrimp. Ruskin. Tilapia. Clams. Oysters. Eggs.  Dairy: Low-fat milk. Cheese. Greek yogurt.  Beverages: Water. Red wine. Herbal tea.  Fats and oils: Extra virgin olive oil. Avocado oil. Grape seed oil.  Sweets and desserts: [place] yogurt with honey. Baked apples. Poached pears. Trail mix.  Seasoning and other foods: Basil. Cilantro. Coriander. Cumin. Mint. Parsley. Sage. Rosemary. Tarragon. Garlic. Oregano. Thyme. Pepper. Balsalmic vinegar. Tahini. Hummus. Tomato sauce. Olives. Mushrooms.  Limit these  Grains: Prepackaged pasta or rice dishes. Prepackaged cereal with added sugar.  Vegetables: Deep fried potatoes (french fries).  Fruits: Fruit canned in syrup.  Meats and other protein foods: Beef. Pork. Lamb. Poultry with skin. Hot dogs. Berniece Salines.  Dairy: Ice cream. Sour cream. Whole milk.  Beverages: Juice. Sugar-sweetened soft drinks. Beer. Liquor and spirits.  Fats and oils: Butter. Canola oil. Vegetable oil. Beef fat (tallow). Lard.  Sweets and desserts: Cookies. Cakes. Pies. Candy.  Seasoning and other foods: Mayonnaise. Premade sauces and marinades.  The items listed may not be a complete list. Talk with your dietitian about what dietary choices are right for you. Summary  The Mediterranean diet includes both food and lifestyle choices.  Eat a variety of fresh fruits and vegetables, beans, nuts, seeds, and whole grains.  Limit the amount of red meat and sweets that you eat.  Talk with your health care provider about whether it is safe for you to drink red wine in moderation. This means 1 glass a day for  nonpregnant women and 2 glasses a day for men. A glass of wine equals 5 oz (150 mL). This information is not intended to replace advice given to you by your health care provider. Make sure you discuss any questions you have with your health care provider. Document Released: 08/12/2015 Document Revised: 09/14/2015 Document Reviewed: 08/12/2015 Elsevier Interactive Patient Education  2017 [employer].

## 2016-02-01 NOTE — Progress Notes (Signed)
   [name] Kerrin Mo, MD Phone: [phone]  Reason For Visit: F/U for Diabetes and HTN   CHRONIC DM, Type 2: Reports no concerns,  CBGs: does not check  Meds: Currently on metformin 500 mg   Reports good compliance. Tolerating well w/o side-effects Currently on ACEi / ARB  Lifestyle: Discussed mediterranean diet and increasing exercise  Any hypoglycemia episodes: Denies palpations, diaphoresis, fatigue, weakness, jittery Denies polyuria, visual changes, numbness or tingling.  CHRONIC HTN: Reports   Current Meds - lisinopril 2.5 mg, Atenolol (started for migraines)  Reports good compliance, took meds today. Tolerating well, w/o complaints. Lifestyle -  As above  Denies CP, dyspnea, HA, edema, dizziness / lightheadedness  Patient with concerns about her memory - plan for discussion at next visit   Past Medical History Reviewed problem list.  Medications- reviewed and updated No additions to family history Social history- patient is a non-smoker  Objective: BP 122/60   Pulse 63   Temp 98.5 F (36.9 C) (Oral)   Ht 5\' 4"  (1.626 m)   Wt 205 lb 6.4 oz (93.2 kg)   SpO2 96%   BMI 35.26 kg/m  Gen: NAD, alert, cooperative with exam Cardio: regular rate and rhythm, S1S2 heard, no murmurs appreciated Pulm: clear to auscultation bilaterally, no wheezes, rhonchi or rales GI: soft, non-tender, non-distended, bowel sounds present, Extremities: warm, well perfused, No edema,  Skin: dry, intact, no rashes or lesions  Assessment/Plan: See problem based a/p  Essential hypertension, benign Well controlled  - Continue lisinopril and atenolol   Type 2 diabetes mellitus A1C 5.6 at last visit.  - Continue metformin 500 mg BID  - Discussed the importance of patient getting an eye exam; still has not seen opthalmology  - Follow up in next couple of weeks to discuss memory concerns \ - A1C at next visit  - Lipid panel today

## 2016-02-03 NOTE — Assessment & Plan Note (Signed)
Well controlled  - Continue lisinopril and atenolol

## 2016-02-03 NOTE — Assessment & Plan Note (Addendum)
A1C 5.6 at last visit.  - Continue metformin 500 mg BID  - Discussed the importance of patient getting an eye exam; still has not seen opthalmology  - Follow up in next couple of weeks to discuss memory concerns \ - A1C at next visit  - Lipid panel today

## 2016-02-08 ENCOUNTER — Ambulatory Visit (INDEPENDENT_AMBULATORY_CARE_PROVIDER_SITE_OTHER): Payer: Medicare Other | Admitting: Family Medicine

## 2016-02-08 ENCOUNTER — Encounter: Payer: Self-pay | Admitting: Family Medicine

## 2016-02-08 ENCOUNTER — Other Ambulatory Visit: Payer: Self-pay | Admitting: Family Medicine

## 2016-02-08 VITALS — BP 124/78 | HR 84 | Temp 99.1°F | Ht 64.0 in | Wt 204.2 lb

## 2016-02-08 DIAGNOSIS — N3001 Acute cystitis with hematuria: Secondary | ICD-10-CM | POA: Diagnosis not present

## 2016-02-08 LAB — POCT URINALYSIS DIPSTICK
Bilirubin, UA: NEGATIVE
GLUCOSE UA: NEGATIVE
Ketones, UA: NEGATIVE
NITRITE UA: POSITIVE
PROTEIN UA: NEGATIVE
Spec Grav, UA: >=1.03
UROBILINOGEN UA: 0.2
pH, UA: 6

## 2016-02-08 LAB — POCT UA - MICROSCOPIC ONLY

## 2016-02-08 MED ORDER — PHENAZOPYRIDINE HCL 200 MG PO TABS: 200.0000 mg | ORAL_TABLET | Freq: Three times a day (TID) | ORAL | 0 refills | Status: DC | PRN

## 2016-02-08 MED ORDER — CEPHALEXIN 500 MG PO CAPS: 500.0000 mg | ORAL_CAPSULE | Freq: Four times a day (QID) | ORAL | 0 refills | Status: DC

## 2016-02-08 NOTE — Progress Notes (Signed)
   Subjective: CC: ?kidney infection [id number] Marie [name] is a 61 y.o. female presenting to clinic today for same day appointment. PCP: Lockie Pares, MD Concerns today include:  1. UTI Patient reports that she started having low back pain, dysuria about 3 days ago.  She reports that it was actually difficult to urinate at that time.  She notes that she started drinking cran-grape juice and this helped tremendously with the back pain, urgency and dysuria.  She continues to have symptoms but to a much lesser degree.  She reports to the office today because she has a PMH significant for UTI/ Pyelonephritis/renal stones and she has needed hospitalization in past.  She did not want to let a potential infection worsen.  She denies fevers, chills, nausea, vomiting, hematuria.  Allergies  Allergen Reactions  . Statins Other (See Comments)    Reaction: lipitor, zocor - muscle pain, ache  . Amoxicillin Diarrhea and Other (See Comments)    Has patient had a PCN reaction causing immediate rash, facial/tongue/throat swelling, SOB or lightheadedness with hypotension: No Has patient had a PCN reaction causing severe rash involving mucus membranes or skin necrosis: No Has patient had a PCN reaction that required hospitalization No     Upset stomach Has patient had a PCN reaction occurring within the last 10 years: No If all of the above answers are "NO", then may proceed with Cephalosporin use.   . Fibrates Other (See Comments)    Cramps  . Sulfa Antibiotics Other (See Comments)    Reaction: unknown, but not hypersensitivity    Social Hx reviewed. MedHx, current medications and allergies reviewed.  Please see EMR. ROS: Per HPI  Objective: Office vital signs reviewed. BP 124/78   Pulse 84   Temp 99.1 F (37.3 C) (Oral)   Ht 5\' 4"  (1.626 m)   Wt 204 lb 3.2 oz (92.6 kg)   SpO2 98%   BMI 35.05 kg/m   Physical Examination:  General: Awake, alert, well nourished, No acute distress Cardio:  regular rate Pulm: normal WOB on room air GU: +suprapubic TTP, no CVA TTP MSK: normal gait, no midline TTP to spine  Results for orders placed or performed in visit on 02/08/16 (from the past 24 hour(s))  Urinalysis Dipstick     Status: Abnormal   Collection Time: 02/08/16  1:40 PM  Result Value Ref Range   Color, UA YELLOW    Clarity, UA CLOUDY    Glucose, UA NEG    Bilirubin, UA NEG    Ketones, UA NEG    Spec Grav, UA >=1.030    Blood, UA TRACE-INTACT    pH, UA 6.0    Protein, UA NEG    Urobilinogen, UA 0.2    Nitrite, UA POSITIVE    Leukocytes, UA small (1+) (A) Negative   Assessment/ Plan: 61 y.o. female   1. Acute cystitis with hematuria.  UA with nitrites and Leuks.  Also trace blood.  Patient with h/o recurrent UTI/ renal stones.  No evidence of Pyelonephritis on exam.  Temp 99. Otherwise, VSS.  Has tolerated Keflex in past without issues. - Urinalysis Dipstick - POCT UA - Microscopic Only - Urine culture - Keflex 500mg  PO QID x10d - Pyridium 200mg  PO TID prn - Encouraged to drink plenty of fluids - Return precautions reviewed - Follow up with PCP prn   Janora Norlander, DO PGY-3, [facility]

## 2016-02-08 NOTE — Patient Instructions (Signed)

## 2016-02-11 ENCOUNTER — Telehealth: Payer: Self-pay | Admitting: Internal Medicine

## 2016-02-11 MED ORDER — PRAVASTATIN SODIUM 20 MG PO TABS: 20.0000 mg | ORAL_TABLET | Freq: Every day | ORAL | 3 refills | Status: AC

## 2016-02-11 NOTE — Telephone Encounter (Signed)
Please let patient know I have reviewed her cholesterol labs and feel she would benefit from a cholesterol medication. I will send over this medication to the pharmacy

## 2016-02-14 ENCOUNTER — Other Ambulatory Visit: Payer: Self-pay | Admitting: Internal Medicine

## 2016-02-14 DIAGNOSIS — E119 Type 2 diabetes mellitus without complications: Secondary | ICD-10-CM

## 2016-02-14 NOTE — Telephone Encounter (Signed)
Pt states the pharmacy does not have the medication and pt would like [place] to send it over. ep

## 2016-02-14 NOTE — Telephone Encounter (Signed)
Pharmacy has received the RX, she actually needs to let the MD know that she is allergic to statins.  She has used them in the past and they made her "very itchy all over her body".  Advised I would send a message to MD. Clinton Sawyer, Salome Spotted, [name]

## 2016-02-15 ENCOUNTER — Telehealth: Payer: Self-pay | Admitting: *Deleted

## 2016-02-15 NOTE — Telephone Encounter (Signed)
[name] from [facility] called stating she will release urine culture results in about 10 mins.  However; patient does have Kleb pneumo.  On lab report is a list of drugs suggested for treatment.  Derl Barrow, RN

## 2016-02-15 NOTE — Telephone Encounter (Signed)
Urine culture reviewed.  Bacteria is sensitive to Keflex.  Continue current abx.

## 2016-02-17 ENCOUNTER — Encounter: Payer: Self-pay | Admitting: Internal Medicine

## 2016-02-23 ENCOUNTER — Encounter: Payer: Self-pay | Admitting: Internal Medicine

## 2016-03-02 LAB — URINE CULTURE

## 2016-03-02 LAB — LIPID PANEL
CHOLESTEROL TOTAL: 289 mg/dL — AB (ref 100–199)
Chol/HDL Ratio: 9.6 ratio units — ABNORMAL HIGH (ref 0.0–4.4)
HDL: 30 mg/dL — AB (ref >39)
TRIGLYCERIDES: 516 mg/dL — AB (ref 0–149)

## 2016-03-02 LAB — PLEASE NOTE

## 2016-04-25 ENCOUNTER — Encounter: Payer: Self-pay | Admitting: Student

## 2016-04-25 ENCOUNTER — Ambulatory Visit (INDEPENDENT_AMBULATORY_CARE_PROVIDER_SITE_OTHER): Payer: Medicare Other | Admitting: Student

## 2016-04-25 DIAGNOSIS — J069 Acute upper respiratory infection, unspecified: Secondary | ICD-10-CM | POA: Diagnosis not present

## 2016-04-25 MED ORDER — ALBUTEROL SULFATE HFA 108 (90 BASE) MCG/ACT IN AERS: 2.0000 | INHALATION_SPRAY | Freq: Four times a day (QID) | RESPIRATORY_TRACT | 2 refills | Status: AC | PRN

## 2016-04-25 NOTE — Patient Instructions (Signed)
Follow up with PCP in 1 week Make an appointment with Dr Valentina Lucks for Pulmonary Function tests after your symptoms improve STOP SMOKING, PLEASE TALK TO YOUR DOCTOR ABOUT THIS Take Albuterol 2 puffs every 4-6 hours for shortness of breath Take Alka Seltzer cold and flu for cough If you feel more shortness of breath go to the ED Call the office with questions or concerns

## 2016-04-25 NOTE — Assessment & Plan Note (Deleted)
History and physical exam consistent with viral URI. She does have some SOB associated but unclear how much her significant smoking history is contributing. Lung exam benign although she O2 sat was 93 - will try OTC cough reliever with albuterol PRN - follow up with PCP in 1 week - follow with Dr Valentina Lucks for PFTs after this acute illness improved - patient counseled to stop smoking, will follow with PCP for this

## 2016-04-25 NOTE — Progress Notes (Signed)
   Subjective:    Patient ID: [name], female    DOB: 1955-06-20, 61 y.o.   MRN: [id number]   CC: cough and congestion  HPI: 61 y/o F present for cough an congestion  Cough and conhgestion - started 4 days agio - has tried zyrtec with little relief - no fevers, some SOM - she is a 1-1.5 ppd smoker - she does not have a diagnosis of COPD   Smoking status reviewed  Review of Systems  Per HPI, else denies chest pain   Objective:  BP 120/74   Pulse 72   Temp 98.6 F (37 C) (Oral)   Wt 212 lb (96.2 kg)   SpO2 93%   BMI 36.39 kg/m  Vitals and nursing note reviewed  General: NAD Cardiac: RRR,  Respiratory: CTAB, normal effort, able to speak without issue Skin: warm and dry, no rashes noted Neuro: alert and oriented, no focal deficits   Assessment & Plan:    Viral URI  History and physical exam consistent with viral URI. She does have some SOB associated but unclear how much her significant smoking history is contributing. Lung exam benign although she O2 sat was 93 - will try OTC cough reliever with albuterol PRN - follow up with PCP in 1 week - follow with Dr Valentina Lucks for PFTs after this acute illness improved - patient counseled to stop smoking, will follow with PCP for this   Infinity Jeffords A. Lincoln Brigham MD, [facility] Resident PGY-3 Pager [phone]

## 2016-04-25 NOTE — Assessment & Plan Note (Signed)
History and physical exam consistent with viral URI. She does have some SOB associated but unclear how much her significant smoking history is contributing. Lung exam benign although she O2 sat was 93 - will try OTC cough reliever with albuterol PRN - follow up with PCP in 1 week - follow with Dr Valentina Lucks for PFTs after this acute illness improved - patient counseled to stop smoking, will follow with PCP for this

## 2016-04-26 ENCOUNTER — Telehealth: Payer: Self-pay | Admitting: Internal Medicine

## 2016-04-26 NOTE — Telephone Encounter (Signed)
Pt came in yesterday for a cough and this morning pt coughed up blood. Pt would like to know if we should call something else in for her. Please advise ep

## 2016-04-27 NOTE — Telephone Encounter (Signed)
Called patient to discuss symptoms. States that she's had some slightly blood-tinged sputum yesterday morning however this self resolved. She is still feeling pretty congested with continued cough. She denies any fevers or chills, nausea or vomiting. We discussed that if she is not feeling better by tomorrow she should make an appointment to see me as I have a couple of available openings.

## 2016-05-12 ENCOUNTER — Other Ambulatory Visit: Payer: Self-pay | Admitting: Internal Medicine

## 2016-05-26 ENCOUNTER — Encounter: Payer: Self-pay | Admitting: Psychology

## 2016-05-26 ENCOUNTER — Encounter: Payer: Self-pay | Admitting: Internal Medicine

## 2016-05-26 ENCOUNTER — Ambulatory Visit (INDEPENDENT_AMBULATORY_CARE_PROVIDER_SITE_OTHER): Payer: Medicare Other | Admitting: Internal Medicine

## 2016-05-26 VITALS — BP 146/82 | HR 85 | Temp 98.0°F | Ht 64.0 in | Wt 211.0 lb

## 2016-05-26 DIAGNOSIS — F332 Major depressive disorder, recurrent severe without psychotic features: Secondary | ICD-10-CM

## 2016-05-26 DIAGNOSIS — R413 Other amnesia: Secondary | ICD-10-CM | POA: Diagnosis not present

## 2016-05-26 DIAGNOSIS — E119 Type 2 diabetes mellitus without complications: Secondary | ICD-10-CM | POA: Diagnosis not present

## 2016-05-26 DIAGNOSIS — G43009 Migraine without aura, not intractable, without status migrainosus: Secondary | ICD-10-CM | POA: Diagnosis not present

## 2016-05-26 MED ORDER — TOPIRAMATE 100 MG PO TABS: 100.0000 mg | ORAL_TABLET | Freq: Two times a day (BID) | ORAL | 4 refills | Status: DC

## 2016-05-26 NOTE — Patient Instructions (Signed)
I want to to increase your Effexor to 3 pills. Please take 2 pills in the morning and 1 pill at night. I want to follow-up with [facility] I will provide you with the information for this. Please follow-up with me in 2 weeks. If your feelings of wanting to hurt yourself worsen. Please either call the clinic or go immediately to the ED.   National Suicide Prevention Lifeline: 8588327527 The Lifeline provides 24/7, free and confidential support for people in distress, prevention and crisis resources for you or your loved ones, and best practices for professionals.
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
## 2016-05-26 NOTE — Progress Notes (Signed)
Dr. Emmaline Life requested a [place].   Presenting Issue:  Anxiety and depression  Report of symptoms:  Patient has low mood energy, worries frequently (difficulty identifying sources of worry), passive SI  Duration of CURRENT symptoms:  Symptoms have worsened in last few months with no clear trigger  Age of onset of first mood disturbance:  Patient unsure of when symptoms began but notes she has had difficulty with anxiety and depression since childhood  Impact on function:  Patient remains at home mostly, receives support from sisters.  Psychiatric History - Diagnoses: History of anxiety and depression, possibly GAD - Hospitalizations: None - Pharmacotherapy: Effexor - Outpatient therapy: Previously saw counselors in [place] (unsure which but found them helpful) and at [facility] history of psychiatric issues: Unspecified mental illness, alcoholism and dementia in various family members  Current and history of substance use:  None  Medical conditions that might explain or contribute to symptoms:    PHQ-9:   GAD-7:   MDQ (if indicated):  Some questions administered verbally, patient denied mania symptoms  Assessment / Plan / Recommendations: Patient and sister met with [facility]. Patient was attentive, appropriate range of affect, but has some difficulty understand questions and remembering details of her past. Patient noted trauma history. Patient lives alone but has financial and emotional support from sisters who live close. Patient has had successful experiences with therapists in the community but is now on Medicare. Patient requested [facility] call and confirm a therapist that she can see without paying anything out of pocket. [facility] will call patient's sister with this information at [phone]. Patient will go to [facility] next week to assess whether medication changes are necessary.  [facility] provided patient and sister with resources should SI increase. [facility] consulted with  Dr. Gwenlyn Saran about patient's suicidal thoughts. See assessment below.  Suicide Assessment  Plan: None - How specific is the plan:  - How lethal are the means:  - Does the patient have access to the means:  - Does the patient have social support: Yes, sisters live in neighborhood and frequently check on her  Protective factors (what has kept the patient from self-harm thus far):  Patient knows family cares for her  Substance use / abuse:  none  Presence of hallucinations / delusions: None (patients response suggested that she only partially understood question-she denied hallucinations but then noted that she doesn't have a history of falling.)  History of SI / Attempts: Recurrent SI, possibly active SI several years ago, however patient unable to clarify when and what triggered it  Family history of attempted or completed suicide: None  Duration and Intensity of SI:  Several years, currently thoughts occur at least a few times daily but are passive "I wish I wasn't here"  History of prior psychiatric hospitalizations: None  Chart review for additional risk factors (cite chronic pain, insomnia, panic attacks, age, gender, if present): 61 year old, lives alone   Coping mechanisms: Seeking family support  How likely are you to act on these thoughts of hurting yourself or ending your life sometime over the next month? NOT LIKELY AT ALL SOMEWHAT LIKELY VERY LIKELY  If somewhat or very likely, consider hospitalization.  Consult suicide protocol if you have not yet done so.  Per my impression, Patient is safe and in no immediate danger of hurting herself. She is monitored by family regularly.   Warmhandoff:    Warm Hand Off Completed.

## 2016-05-30 DIAGNOSIS — R413 Other amnesia: Secondary | ICD-10-CM | POA: Insufficient documentation
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
## 2016-05-30 NOTE — Assessment & Plan Note (Addendum)
Currently severely depressed which is likely contributing. However will have to consider work up in the near future if persistent  Follow up in two weeks

## 2016-06-09 ENCOUNTER — Telehealth: Payer: Self-pay | Admitting: Psychology

## 2016-06-09 NOTE — Telephone Encounter (Signed)
[facility] Caroleen Hamman) called patient to provide information for [facility] in [place], offering in home counseling at no cost with medicaid/medicare. Patient appeared interested and plans to call this service today.

## 2016-06-13 ENCOUNTER — Ambulatory Visit
Admission: RE | Admit: 2016-06-13 | Discharge: 2016-06-13 | Disposition: A | Discharge status: Home / Self Care | Payer: Medicare Other | Source: Ambulatory Visit | Attending: Family Medicine | Admitting: Family Medicine

## 2016-06-13 ENCOUNTER — Ambulatory Visit (INDEPENDENT_AMBULATORY_CARE_PROVIDER_SITE_OTHER): Payer: Medicare Other | Admitting: Family Medicine

## 2016-06-13 ENCOUNTER — Encounter: Payer: Self-pay | Admitting: Family Medicine

## 2016-06-13 VITALS — BP 110/70 | HR 68 | Temp 98.1°F | Ht 64.0 in | Wt 211.0 lb

## 2016-06-13 DIAGNOSIS — R05 Cough: Secondary | ICD-10-CM

## 2016-06-13 DIAGNOSIS — R0602 Shortness of breath: Secondary | ICD-10-CM | POA: Diagnosis not present

## 2016-06-13 DIAGNOSIS — R059 Cough, unspecified: Secondary | ICD-10-CM

## 2016-06-13 MED ORDER — LOSARTAN POTASSIUM 25 MG PO TABS: 12.5000 mg | ORAL_TABLET | Freq: Every day | ORAL | 3 refills | Status: DC

## 2016-06-13 NOTE — Assessment & Plan Note (Addendum)
Patient is presenting with severe cough that is mostly dry of unknown etiology. Patient is a 45-pack-year smoker. Differential includes viral URI (rhinorrhea is minimal, and symptom duration is >3 months -- but sequential viral infections are also a possibility); COPD (not previously diagnosed, patient has no wheezing on exam); pneumonia (no abnormal breath sounds noted on exam, patient is afebrile); GERD (patient denying symptoms of reflux, already on PPI); drug allergy (patient on ACEi >> severity of cough makes this less likely); and sequela of smoking. - Chest x-ray ordered >> I informed patient that if chest x-ray was unremarkable and there would likely be no need for additional treatment. (I will only contact her if there is something that needs to be discussed) - Discontinue lisinopril - Initiate low-dose losartan; 12.5 mg daily - Smoking cessation encouraged - OTC antitussives - Reassurance >> informed that if viral URI and symptoms would self resolve. - Return precautions discussed - Follow-up if necessary in 2 weeks  Next: Patient will need a low-dose CT scan screening in the near future. Would wait until coughing resolves (in case there is an infectious process), but would reconsider if coughing fails to improve over then next 4-8 weeks.

## 2016-06-13 NOTE — Progress Notes (Signed)
HPI  CC: COUGH Started about 3 months ago. Getting worse. No episodes of gradual improvement followed by worsening.  Has been coughing for 3 months. Cough is: dry, occasionally productive (white, some green) Sputum production: mild/med Medications tried: albuterol/symbicort >> no help Taking blood pressure medications: yes, ACEi  Symptoms Runny nose: very minimal Mucous in back of throat: no Throat burning or reflux: no Wheezing or asthma: "my family says I wheeze when I'm sleeping" Fever: no Chest Pain: no Shortness of breath: no Leg swelling: no Hemoptysis: no Weight loss: no Night Sweats: no  ROS see HPI Smoking Status >> Active smoker started age 34 >> 1 pack/day Approximately a 59 packyear history  Objective: BP 110/70 (BP Location: Right Arm, Patient Position: Sitting, Cuff Size: Large)   Pulse 68   Temp 98.1 F (36.7 C) (Oral)   Ht 5\' 4"  (1.626 m)   Wt 211 lb (95.7 kg)   SpO2 97%   BMI 36.22 kg/m  Gen: NAD, alert, cooperative, and pleasant. HEENT: MMM, EOMI, PERRLA, OP erythematous without evidence of exudates, TMs clear bilaterally, no LAD, neck full ROM. CV: RRR, no murmur Resp: CTAB, no wheezes, non-labored; occasional deep dry cough noted with talking. Ext: No edema, warm Neuro: Alert and oriented, Speech clear, No gross deficits   Assessment and plan:  Cough Patient is presenting with severe cough that is mostly dry of unknown etiology. Patient is a 59-pack-year smoker. Differential includes viral URI (rhinorrhea is minimal, and symptom duration is >3 months -- but sequential viral infections are also a possibility); COPD (not previously diagnosed, patient has no wheezing on exam); pneumonia (no abnormal breath sounds noted on exam, patient is afebrile); GERD (patient denying symptoms of reflux, already on PPI); drug allergy (patient on ACEi >> severity of cough makes this less likely); and sequela of smoking. - Chest x-ray ordered >> I informed  patient that if chest x-ray was unremarkable and there would likely be no need for additional treatment. (I will only contact her if there is something that needs to be discussed) - Discontinue lisinopril - Initiate low-dose losartan; 12.5 mg daily - Smoking cessation encouraged - OTC antitussives - Reassurance >> informed that if viral URI and symptoms would self resolve. - Return precautions discussed - Follow-up if necessary in 2 weeks  Next: Patient will need a low-dose CT scan screening in the near future. Would wait until coughing resolves (in case there is an infectious process), but would reconsider if coughing fails to improve over then next 4-8 weeks.   Orders Placed This Encounter  Procedures  . DG Chest 2 View    Standing Status:   Future    Number of Occurrences:   1    Standing Expiration Date:   08/13/2017    Order Specific Question:   Reason for Exam (SYMPTOM  OR DIAGNOSIS REQUIRED)    Answer:   Cough    Order Specific Question:   Is patient pregnant?    Answer:   No    Order Specific Question:   Preferred imaging location?    Answer:   [facility]    Order Specific Question:   Radiology Contrast Protocol - do NOT remove file path    Answer:   [email or web address]    Meds ordered this encounter  Medications  . losartan (COZAAR) 25 MG tablet    Sig: Take 0.5 tablets (12.5 mg total) by mouth daily.    Dispense:  45 tablet    Refill:  3     Elberta Leatherwood, MD,MS,  PGY3 06/13/2016 5:21 PM

## 2016-06-13 NOTE — Patient Instructions (Signed)
Cough  Treatment - you should: - Push fluids the best you can with water and/or Gatorade. - Over-the-counter cough drops or sprays may help. - A spoonful of honey before bed can help soothe any sore throat you may have. - I have changed your lisinopril to losartan >> take one half tablet daily.   You should be better in: Approximately 2 weeks Call us if you have severe shortness of breath, high fever or are not better in 2 weeks

## 2016-08-07 ENCOUNTER — Emergency Department (HOSPITAL_COMMUNITY): Payer: Medicare Other

## 2016-08-07 ENCOUNTER — Encounter (HOSPITAL_COMMUNITY): Payer: Self-pay | Admitting: Emergency Medicine

## 2016-08-07 ENCOUNTER — Emergency Department (HOSPITAL_COMMUNITY)
Admission: EM | Admit: 2016-08-07 | Discharge: 2016-08-07 | Discharge status: Against Medical Advice | Payer: Medicare Other | Attending: Emergency Medicine | Admitting: Emergency Medicine

## 2016-08-07 DIAGNOSIS — Z5321 Procedure and treatment not carried out due to patient leaving prior to being seen by health care provider: Secondary | ICD-10-CM | POA: Diagnosis not present

## 2016-08-07 DIAGNOSIS — S2231XA Fracture of one rib, right side, initial encounter for closed fracture: Secondary | ICD-10-CM | POA: Diagnosis not present

## 2016-08-07 DIAGNOSIS — R0789 Other chest pain: Secondary | ICD-10-CM | POA: Diagnosis present

## 2016-08-07 NOTE — ED Notes (Signed)
Patient called to room x2 without answer. Patient LBTC, per registration

## 2016-08-07 NOTE — ED Triage Notes (Signed)
Pt c/o right ribcage pain onset 2 weeks while coughing severely, felt sudden onset pain as if "rib had snapped," has had cough with green and yellow sputum x 3 weeks.

## 2016-08-08 ENCOUNTER — Encounter (HOSPITAL_COMMUNITY): Payer: Self-pay | Admitting: Emergency Medicine

## 2016-08-08 ENCOUNTER — Ambulatory Visit (HOSPITAL_COMMUNITY)
Admission: EM | Admit: 2016-08-08 | Discharge: 2016-08-08 | Disposition: A | Discharge status: Home / Self Care | Payer: Medicare Other | Attending: Family Medicine | Admitting: Family Medicine

## 2016-08-08 DIAGNOSIS — R05 Cough: Secondary | ICD-10-CM

## 2016-08-08 DIAGNOSIS — R053 Chronic cough: Secondary | ICD-10-CM

## 2016-08-08 DIAGNOSIS — R0781 Pleurodynia: Secondary | ICD-10-CM

## 2016-08-08 MED ORDER — AZITHROMYCIN 250 MG PO TABS: 250.0000 mg | ORAL_TABLET | Freq: Every day | ORAL | 0 refills | Status: DC

## 2016-08-08 MED ORDER — HYDROCODONE-HOMATROPINE 5-1.5 MG/5ML PO SYRP: 5.0000 mL | ORAL_SOLUTION | Freq: Four times a day (QID) | ORAL | 0 refills | Status: DC | PRN

## 2016-08-08 NOTE — ED Provider Notes (Signed)
  [name]   [id number] 08/08/16 Arrival Time: 6160  ASSESSMENT & PLAN:  1. Persistent cough   2. Rib pain on right side     Meds ordered this encounter  Medications  . azithromycin (ZITHROMAX) 250 MG tablet    Sig: Take 1 tablet (250 mg total) by mouth daily. Take first 2 tablets together, then 1 every day until finished.    Dispense:  6 tablet    Refill:  0  . HYDROcodone-homatropine (HYCODAN) 5-1.5 MG/5ML syrup    Sig: Take 5 mLs by mouth every 6 (six) hours as needed for cough.    Dispense:  90 mL    Refill:  0   Medication sedation precautions. OTC Aleve BID for rib discomfort. Will f/u here or PCP if not showing improvement by next week, sooner if needed.  Reviewed expectations re: course of current medical issues. Questions answered. Outlined signs and symptoms indicating need for more acute intervention. Patient verbalized understanding. After Visit Summary given.   SUBJECTIVE:  Marie [name] is a 61 y.o. female who presents with complaint of R sided rib discomfort for a few days. Reports a persistent, dry cough for the past 2-3 weeks. No fever reported. No SOB. Questions occasional wheezing but not sure. Smoker. OTC cough meds without relief. Worse at night. No n/v associated. Sometimes cough worse after exertion.  Had x-rays taken in ED yesterday but she left before being seen by provider.  ROS: As per HPI.   OBJECTIVE:  Vitals:   08/08/16 1006  BP: 122/80  Pulse: 73  Temp: 98.7 F (37.1 C)  TempSrc: Oral  SpO2: 93%     General appearance: alert; no distress Lungs: clear to auscultation bilaterally; dry cough Heart: regular rate and rhythm Extremities: no cyanosis or edema; symmetrical with no gross deformities Skin: warm and dry Psychological:  alert and cooperative; normal mood and affect  Dg Chest 2 View  Result Date: 08/07/2016 CLINICAL DATA:  Concern for right rib injury. EXAM: CHEST  2 VIEW COMPARISON:  Chest x-ray dated  June 13, 2016. FINDINGS: The cardiomediastinal silhouette is normal in size. Normal pulmonary vascularity. No focal consolidation, pleural effusion, or pneumothorax. No acute osseous abnormality. IMPRESSION: 1. No displaced rib fractures. Please note that nondisplaced rib fractures can be occult on conventional radiography. 2.  No active cardiopulmonary disease. Electronically Signed   By: Titus Dubin M.D.   On: 08/07/2016 16:14    Allergies  Allergen Reactions  . Statins Other (See Comments)    Reaction: lipitor, zocor - muscle pain, ache  . Amoxicillin Diarrhea and Other (See Comments)    Has patient had a PCN reaction causing immediate rash, facial/tongue/throat swelling, SOB or lightheadedness with hypotension: No Has patient had a PCN reaction causing severe rash involving mucus membranes or skin necrosis: No Has patient had a PCN reaction that required hospitalization No     Upset stomach Has patient had a PCN reaction occurring within the last 10 years: No If all of the above answers are "NO", then may proceed with Cephalosporin use.   . Fibrates Other (See Comments)    Cramps  . Sulfa Antibiotics Other (See Comments)    Reaction: unknown, but not hypersensitivity    PMHx, SurgHx, SocialHx, Medications, and Allergies were reviewed in the Visit Navigator and updated as appropriate.      Vanessa Kick, MD 08/08/16 1041

## 2016-08-08 NOTE — ED Triage Notes (Addendum)
Pt believes she have broken a rib on her right side from coughing.  Pt went to ED yesterday, but was unable to stay after waiting there for several hours.  They did a chest xray yesterday which showed no fracture.  Pt has had a cough for 2-3 weeks, untreated.

## 2016-08-08 NOTE — Discharge Instructions (Addendum)
Today you were diagnosed with the following: 1. Persistent cough   2. Rib pain on right side    You have not been prescribed prescription medications this visit.   Meds ordered this encounter  Medications   lisinopril (PRINIVIL,ZESTRIL) 10 MG tablet    Sig: Take 10 mg by mouth daily.   azithromycin (ZITHROMAX) 250 MG tablet    Sig: Take 1 tablet (250 mg total) by mouth daily. Take first 2 tablets together, then 1 every day until finished.    Dispense:  6 tablet    Refill:  0   HYDROcodone-homatropine (HYCODAN) 5-1.5 MG/5ML syrup    Sig: Take 5 mLs by mouth every 6 (six) hours as needed for cough.    Dispense:  90 mL    Refill:  0    Please take all medications as directed.  If you are not improving over the next few days or feel you are worsening please follow up here or the Emergency Department if you are unable to see your regular doctor.  Be aware, your cough medication may cause drowsiness. Please do not drive, operate heavy machinery or make important decisions while on this medication, it can cloud your judgement.  You may use over the counter ibuprofen or acetaminophen as needed.

## 2016-08-18 ENCOUNTER — Other Ambulatory Visit: Payer: Self-pay | Admitting: Internal Medicine

## 2016-08-18 DIAGNOSIS — E119 Type 2 diabetes mellitus without complications: Secondary | ICD-10-CM

## 2016-08-21 ENCOUNTER — Telehealth: Payer: Self-pay | Admitting: Internal Medicine

## 2016-08-21 MED ORDER — LISINOPRIL 2.5 MG PO TABS: 2.5000 mg | ORAL_TABLET | Freq: Every day | ORAL | 2 refills | Status: DC

## 2016-08-21 NOTE — Telephone Encounter (Signed)
Called patient, left messageon voicemail for patient to call back.

## 2016-08-21 NOTE — Telephone Encounter (Signed)
Patient has a question about her medications for someone when you have a chance, she is in waiting room.  Her pharmacy called her and said she is supposed to have 10 mg on her Lisinopril but she says it has been and should be 2.5 mg.

## 2016-08-21 NOTE — Telephone Encounter (Signed)
Lisinopril was stopped by Dr. Alease Frame  And patient was started on Losartan. Discussed with Asha.

## 2016-08-22 NOTE — Telephone Encounter (Signed)
Called patient again, mailbox now full, unable to leave message.

## 2016-08-29 NOTE — Telephone Encounter (Signed)
Left message on voicemail for patient to call back. 

## 2016-09-18 ENCOUNTER — Other Ambulatory Visit: Payer: Self-pay | Admitting: Internal Medicine

## 2016-09-18 DIAGNOSIS — F411 Generalized anxiety disorder: Secondary | ICD-10-CM

## 2016-10-12 ENCOUNTER — Other Ambulatory Visit: Payer: Self-pay | Admitting: Internal Medicine

## 2016-11-01 ENCOUNTER — Telehealth: Payer: Self-pay | Admitting: Internal Medicine

## 2016-11-06 ENCOUNTER — Telehealth: Payer: Self-pay | Admitting: Internal Medicine

## 2016-11-06 DIAGNOSIS — E119 Type 2 diabetes mellitus without complications: Secondary | ICD-10-CM

## 2016-11-06 NOTE — Telephone Encounter (Signed)
Pt pharmacy says shes out of refills on her metformin and she took her last pill today. Needs refill sent to pharmacy.

## 2016-11-07 ENCOUNTER — Other Ambulatory Visit: Payer: Self-pay | Admitting: Internal Medicine

## 2016-11-07 DIAGNOSIS — I10 Essential (primary) hypertension: Secondary | ICD-10-CM

## 2016-11-07 MED ORDER — METFORMIN HCL 500 MG PO TABS: 500.0000 mg | ORAL_TABLET | Freq: Two times a day (BID) | ORAL | 3 refills | Status: DC

## 2016-11-07 NOTE — Telephone Encounter (Signed)
Sent in refill

## 2016-11-17 ENCOUNTER — Encounter: Payer: Self-pay | Admitting: Licensed Clinical Social Worker

## 2016-11-17 ENCOUNTER — Ambulatory Visit (INDEPENDENT_AMBULATORY_CARE_PROVIDER_SITE_OTHER): Payer: Medicare Other | Admitting: Internal Medicine

## 2016-11-17 VITALS — BP 120/80 | HR 79 | Temp 97.8°F | Ht 64.0 in | Wt 209.2 lb

## 2016-11-17 DIAGNOSIS — L723 Sebaceous cyst: Secondary | ICD-10-CM | POA: Diagnosis not present

## 2016-11-17 DIAGNOSIS — E118 Type 2 diabetes mellitus with unspecified complications: Secondary | ICD-10-CM | POA: Diagnosis not present

## 2016-11-17 DIAGNOSIS — E1142 Type 2 diabetes mellitus with diabetic polyneuropathy: Secondary | ICD-10-CM

## 2016-11-17 DIAGNOSIS — I10 Essential (primary) hypertension: Secondary | ICD-10-CM | POA: Diagnosis not present

## 2016-11-17 DIAGNOSIS — F332 Major depressive disorder, recurrent severe without psychotic features: Secondary | ICD-10-CM

## 2016-11-17 LAB — POCT GLYCOSYLATED HEMOGLOBIN (HGB A1C): Hemoglobin A1C: 5.6

## 2016-11-17 MED ORDER — GABAPENTIN 100 MG PO CAPS: 100.0000 mg | ORAL_CAPSULE | Freq: Three times a day (TID) | ORAL | 3 refills | Status: DC

## 2016-11-17 NOTE — Progress Notes (Signed)
Type of Service: Clinical Social Work Consult  Marie [name] is a 61 y.o. female referred by Dr. Emmaline Life for food insecurities  Patient reports :concerns with not having enough food to eat for the month. Patient reports having very little food in her home today and often eats with family when she runs out.  Informed LCSW  "I know I need to eat better but I just don't have the money".   Family & Social:patient lives alone , has own transportation  School / Work /Fun: received SSA  Life changes: no money after bills are paid  Issues discussed: support system, community resources, and [facility] foodbox program Intervention:  solutions focus strategies ; Link to [employer]  ASSESSMENT:.Patient is currently experiencing food insecurities due to limited income after bills are paid.  Patient does not qualify for food assistance benefits. Patient may benefit from, and is in agreement to participate in the [facility] food box program.  1st food box provided to patient today with dates and instructions on when and how to obtain ongoing boxes.  Food Box program will provide patient with a monthly shelf stable food box as well as meat, fruit and vegetables.  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  PLAN: 1. Patient will attend next Food Box Lunch and Learn session Nov. 26th  2. Referral: Community Resources:  Food,   Casimer Lanius, LCSW Licensed Clinical Social Worker [place]   [phone] 4:10 PM

## 2016-11-17 NOTE — Progress Notes (Signed)
   [facility] Kerrin Mo, MD Phone: [phone]  Reason For Visit: Follow up    # CHRONIC DM, Type 2 Meds: metformin Reports  good compliance. Tolerating well w/o side-effects Currently on ACEi / ARB  Lifestyle: Diet: Not really staying away from sugar, kolaid; Exercise the patient does try to walk daily Has not recently seen an ophthalmologist discussed making appointment with her ophthalmologist to be seen for her eyes Denies polyuria, visual changes Endorses numbness or tingling in her bilateral feet.  Patient states she has been having numbness and tingling in her feet for several months.  Patient denies any radiating numbness or tingling.  She denies any weakness in her limbs.  # CHRONIC HTN: Current Meds - losartan  Reports good compliance, took meds today. Tolerating well, w/o complaints. Denies CP, dyspnea, HA, edema, dizziness / lightheadedness  # Depression  Patient had a significant episode of depression several months ago I increased her Effexor to 3 tablets daily from 2 tablets Since that visit she states she has been improving overall-she denies any significant episodes of severe depression.  She denies any sadness or guilt. She denies any SI or HI  Past Medical History Reviewed problem list.  Medications- reviewed and updated No additions to family history Social history- patient is a smoker  Objective: BP 120/80   Pulse 79   Temp 97.8 F (36.6 C) (Oral)   Ht _0  (1.626 m)   Wt 209 lb 3.2 oz (94.9 kg)   SpO2 98%   BMI 35.91 kg/m  Gen: NAD, alert, cooperative with exam Cardio: regular rate and rhythm, S1S2 heard, no murmurs appreciated Pulm: clear to auscultation bilaterally, no wheezes, rhonchi or rales Skin: Rubbery 1 x 1 cm superficial nodule on right breast, with enlarged enlarged prominent follicle no erythema, not painful to touch, not warm to the touch  Neuro: Strength and sensation grossly intact  Diabetic Foot Exam  - Simple   Simple Foot Form Diabetic Foot exam was performed with the following findings:  Yes 11/17/2016  3:00 PM  Visual Inspection No deformities, no ulcerations, no other skin breakdown bilaterally:  Yes Sensation Testing See comments:  Yes Pulse Check Posterior Tibialis and Dorsalis pulse intact bilaterally:  Yes Comments Minimal decrease  in sensation in bilateral feet     Assessment/Plan: See problem based a/p  Essential hypertension, benign Blood pressure well controlled Obtain be met and lipid panel Continue losartan  Type 2 diabetes mellitus A1c today is 5.3, diabetes is well controlled -Continue metformin  Major depression History of depression, PHQ 9  10, no suicidal ideation Patient was seen by me several months ago with increased depression.   - increased her Effexor from 2 pills to 3 pills - She has since improved - Follow up in 3 months   Diabetic neuropathy ([place]) Diabetic foot exam consistent with some minor changes in sensation. -Discussed with patient making sure that she is always wearing shoes and keeping her skin moisturized - will provide patient with gabapentin to help with her polyneuropathy

## 2016-11-17 NOTE — Patient Instructions (Addendum)
It was nice seeing you today.  I am going to prescribe you some gabapentin to help with your neuropathy in your feet.  I am going to continue the losartan.  I have placed a referral for surgery to remove your sebaceous cyst.  We will get some lab work today. Please follow-up in about 1 month

## 2016-11-18 LAB — LIPID PANEL
CHOLESTEROL TOTAL: 228 mg/dL — AB (ref 100–199)
Chol/HDL Ratio: 7.4 ratio — ABNORMAL HIGH (ref 0.0–4.4)
HDL: 31 mg/dL — AB (ref >39)
LDL Calculated: 118 mg/dL — ABNORMAL HIGH (ref 0–99)
TRIGLYCERIDES: 394 mg/dL — AB (ref 0–149)
VLDL Cholesterol Cal: 79 mg/dL — ABNORMAL HIGH (ref 5–40)

## 2016-11-18 LAB — BASIC METABOLIC PANEL
BUN / CREAT RATIO: 16 (ref 9–23)
BUN: 8 mg/dL (ref 6–24)
CHLORIDE: 97 mmol/L (ref 96–106)
CO2: 27 mmol/L (ref 20–29)
Calcium: 9.7 mg/dL (ref 8.7–10.2)
Creatinine, Ser: 0.5 mg/dL — ABNORMAL LOW (ref 0.57–1.00)
GFR calc Af Amer: 123 mL/min/{1.73_m2} (ref >59)
GFR calc non Af Amer: 106 mL/min/{1.73_m2} (ref >59)
GLUCOSE: 93 mg/dL (ref 65–99)
POTASSIUM: 4.3 mmol/L (ref 3.5–5.2)
SODIUM: 139 mmol/L (ref 134–144)

## 2016-11-18 LAB — CBC
Hematocrit: 43 % (ref 34.0–46.6)
Hemoglobin: 14.5 g/dL (ref 11.1–15.9)
MCH: 31.3 pg (ref 26.6–33.0)
MCHC: 33.7 g/dL (ref 31.5–35.7)
MCV: 93 fL (ref 79–97)
PLATELETS: 381 10*3/uL — AB (ref 150–379)
RBC: 4.63 x10E6/uL (ref 3.77–5.28)
RDW: 14.1 % (ref 12.3–15.4)
WBC: 14 10*3/uL — AB (ref 3.4–10.8)

## 2016-11-20 ENCOUNTER — Encounter: Payer: Self-pay | Admitting: Internal Medicine

## 2016-11-20 DIAGNOSIS — E1140 Type 2 diabetes mellitus with diabetic neuropathy, unspecified: Secondary | ICD-10-CM | POA: Insufficient documentation

## 2016-11-20 NOTE — Assessment & Plan Note (Addendum)
Blood pressure well controlled Obtain be met and lipid panel Continue losartan

## 2016-11-20 NOTE — Assessment & Plan Note (Signed)
History of depression, PHQ 9  10, no suicidal ideation Patient was seen by me several months ago with increased depression.   - increased her Effexor from 2 pills to 3 pills - She has since improved - Follow up in 3 months

## 2016-11-20 NOTE — Assessment & Plan Note (Signed)
A1c today is 5.3, diabetes is well controlled -Continue metformin

## 2016-11-20 NOTE — Assessment & Plan Note (Signed)
Diabetic foot exam consistent with some minor changes in sensation. -Discussed with patient making sure that she is always wearing shoes and keeping her skin moisturized - will provide patient with gabapentin to help with her polyneuropathy

## 2017-02-13 ENCOUNTER — Other Ambulatory Visit: Payer: Self-pay

## 2017-02-14 ENCOUNTER — Other Ambulatory Visit: Payer: Self-pay | Admitting: Internal Medicine

## 2017-02-14 ENCOUNTER — Ambulatory Visit: Payer: Self-pay | Admitting: Internal Medicine

## 2017-02-14 DIAGNOSIS — E119 Type 2 diabetes mellitus without complications: Secondary | ICD-10-CM

## 2017-02-14 MED ORDER — METFORMIN HCL 500 MG PO TABS: 500.0000 mg | ORAL_TABLET | Freq: Two times a day (BID) | ORAL | 3 refills | Status: DC

## 2017-02-19 ENCOUNTER — Telehealth: Payer: Self-pay | Admitting: *Deleted

## 2017-02-19 ENCOUNTER — Ambulatory Visit (INDEPENDENT_AMBULATORY_CARE_PROVIDER_SITE_OTHER): Payer: Medicare Other | Admitting: Internal Medicine

## 2017-02-19 ENCOUNTER — Telehealth: Payer: Self-pay | Admitting: Internal Medicine

## 2017-02-19 ENCOUNTER — Other Ambulatory Visit: Payer: Self-pay

## 2017-02-19 ENCOUNTER — Encounter: Payer: Self-pay | Admitting: Internal Medicine

## 2017-02-19 VITALS — BP 122/78 | HR 67 | Temp 98.3°F | Ht 64.0 in | Wt 212.0 lb

## 2017-02-19 DIAGNOSIS — R413 Other amnesia: Secondary | ICD-10-CM

## 2017-02-19 DIAGNOSIS — R059 Cough, unspecified: Secondary | ICD-10-CM

## 2017-02-19 DIAGNOSIS — R0602 Shortness of breath: Secondary | ICD-10-CM | POA: Insufficient documentation

## 2017-02-19 DIAGNOSIS — R05 Cough: Secondary | ICD-10-CM

## 2017-02-19 MED ORDER — TIOTROPIUM BROMIDE MONOHYDRATE 18 MCG IN CAPS: 18.0000 ug | ORAL_CAPSULE | Freq: Every day | RESPIRATORY_TRACT | 12 refills | Status: DC

## 2017-02-19 NOTE — Progress Notes (Signed)
[name] Kerrin Mo, MD Phone: [phone]  Reason For Visit: Acute issues  Presented with her 2 sisters who were involved with most of the visit's contents.  # Memory Loss  -Patient presenting with several months of memory loss.  She was seen last by myself in May 2018 for this issue.  At that time she was severely depressed and thought that this could possibly be due to depression I indicated that if there was no improvement in her memory is that she should return for reevaluation. -Her sisters patient has had continued worsening of her memory .  -Indicates she has difficulty balancing her checkbook she sometimes will get washed with going to places that she normally new very well.  She cannot pay her bills. -Patient lives next door to her one sister and a nearby [place] and comes over regularly for dinners and lunch -There is some history of grandmother having dementia in late 27s - Denies any hallucinations, denies any changes in mood or severe depression - On of the sisters is concerned that metformin or gabapentin may be contributing to patient's memory loss   # Cough  -Patient presenting with severe cough for several years.  She states that this is a cough dry cough.  She is a many many year smoker.  She denies any sputum production associated with his cough.  She indicates having some shortness of breath with exertion.  Denies any chest pain.  Denies any history of your issues with reflux burning sensation in her throat, cough specifically worse in the morning or at night.  She denies any congestion or significant seasonal allergies -Endorses a 45-50 pack year smoking hx.  Symptoms Sputum:no  Fever: no  Shortness of breath:no  Leg Swelling:no  Heart Burn or Reflux:no  Wheezing:no  Post Nasal Drip: no   Red Flags Weight Loss:  no Immunocompromised:  no  PMH Asthma or COPD: no  PMH of Smoking: yes  Using ACEIs: no   # Toe Pain    -Patient apparently stubbed her toe on a chair 3-4 months ago.  She states it was swollen at the time she feels like she broke it.  She evaluated for this issue.  She sometimes has pain in the toe still.  Past Medical History Reviewed problem list.  Medications- reviewed and updated No additions to family history Social history- patient is a smoker  Objective: BP 122/78   Pulse 67   Temp 98.3 F (36.8 C) (Oral)   Ht 5\' 4"  (1.626 m)   Wt 212 lb (96.2 kg)   SpO2 98%   BMI 36.39 kg/m  Gen: NAD, alert, cooperative with exam Cardio: regular rate and rhythm, S1S2 heard, no murmurs appreciated Pulm: clear to auscultation bilaterally, no wheezes, rhonchi or rales Neuro: Cn 2-7 intact Strength equal & normal in upper & lower extremities Able to walk on heels and toes.   Balance normal  No pronator drift, finger to nose     Assessment/Plan: See problem based a/p  Memory loss Worsening memory loss.  Normal neurologic exam.  Currently depression well controlled.  Will place orders for initial dementia workup. Given patient's young age and family concerns, will refer to neurology for further workup - Ambulatory referral to Neurology - CT Head Wo Contrast; Future - CBC - Comprehensive metabolic panel - RPR - HIV antibody - Vitamin B12 - Folate - TSH    Cough Chronic dry cough for several years in a patient with  a 45-year smoking history and SOB with activity -most concerning for COPD-Unlikely reflux or postnasal drip given patient history. Does not have a history of an ACE on her medication list.  Given chest x-ray with normal cardiac size lack of leg swelling unlikely CHF, though patient undoubtedly has some arteriasclerosis with her history of hypertension, smoking, and diabetes -Given patient is a 30 pack year or more history of smoking-will start with a low-dose CT scan per recommendations given no red flags of weight loss or hemoptysis -We will start Spiriva for now and see  if this helps -Will have patient follow-up with Dr. Valentina Lucks for pulmonary function testing -We will obtain a BNP x1 though likely negative

## 2017-02-19 NOTE — Patient Instructions (Signed)
For your memory loss I am going to refer you to a neurologist.  In the meantime I am going to put in the necessary blood workup and head imaging that you will need.  He may want you to do an MRI in the future.  For your cough I am going to get a low-dose CT of your lungs.  As you have had 30 years of smoking.  I want you to get pulmonary function testing with Dr. Valentina Lucks here at our clinic.  Please make sure to not use the Spiriva the day of your testing as well as the day before.  If you do not have much improvement please return as this could be other issues.  Please follow-up with me regarding your blood pressure and diabetes in the next couple of weeks.

## 2017-02-19 NOTE — Telephone Encounter (Signed)
When referral is made to neurology, please call patient's sister with information, at [facility] at [phone] (contact person in chart).

## 2017-02-19 NOTE — Telephone Encounter (Signed)
Received call from [facility] requesting a refill on Metformin and prilosec.    This pharmacy is not on pt list.   Attempted to call pt, but no answer.  She has appt this afternoon will send to provider and CMA to verify this and send if appropriate. Adael Culbreath, Salome Spotted, CMA

## 2017-02-20 LAB — COMPREHENSIVE METABOLIC PANEL
ALBUMIN: 4.3 g/dL (ref 3.5–5.5)
ALK PHOS: 77 IU/L (ref 39–117)
ALT: 14 IU/L (ref 0–32)
AST: 15 IU/L (ref 0–40)
Albumin/Globulin Ratio: 1.7 (ref 1.2–2.2)
BUN / CREAT RATIO: 13 (ref 9–23)
BUN: 8 mg/dL (ref 6–24)
Bilirubin Total: 0.3 mg/dL (ref 0.0–1.2)
CALCIUM: 10.2 mg/dL (ref 8.7–10.2)
CO2: 24 mmol/L (ref 20–29)
CREATININE: 0.6 mg/dL (ref 0.57–1.00)
Chloride: 100 mmol/L (ref 96–106)
GFR calc Af Amer: 115 mL/min/{1.73_m2} (ref >59)
GFR calc non Af Amer: 100 mL/min/{1.73_m2} (ref >59)
GLUCOSE: 91 mg/dL (ref 65–99)
Globulin, Total: 2.5 g/dL (ref 1.5–4.5)
Potassium: 4.1 mmol/L (ref 3.5–5.2)
Sodium: 140 mmol/L (ref 134–144)
TOTAL PROTEIN: 6.8 g/dL (ref 6.0–8.5)

## 2017-02-20 LAB — CBC
HEMOGLOBIN: 14.5 g/dL (ref 11.1–15.9)
Hematocrit: 42.8 % (ref 34.0–46.6)
MCH: 31.1 pg (ref 26.6–33.0)
MCHC: 33.9 g/dL (ref 31.5–35.7)
MCV: 92 fL (ref 79–97)
Platelets: 359 10*3/uL (ref 150–379)
RBC: 4.66 x10E6/uL (ref 3.77–5.28)
RDW: 13.6 % (ref 12.3–15.4)
WBC: 13.2 10*3/uL — AB (ref 3.4–10.8)

## 2017-02-20 LAB — HIV ANTIBODY (ROUTINE TESTING W REFLEX): HIV Screen 4th Generation wRfx: NONREACTIVE

## 2017-02-20 LAB — BRAIN NATRIURETIC PEPTIDE: BNP: 8.1 pg/mL (ref 0.0–100.0)

## 2017-02-20 LAB — VITAMIN B12: Vitamin B-12: 289 pg/mL (ref 232–1245)

## 2017-02-20 LAB — FOLATE: FOLATE: 12.6 ng/mL (ref >3.0)

## 2017-02-20 LAB — TSH: TSH: 1.6 u[IU]/mL (ref 0.450–4.500)

## 2017-02-20 LAB — RPR: RPR: NONREACTIVE

## 2017-02-20 NOTE — Assessment & Plan Note (Signed)
Worsening memory loss.  Normal neurologic exam.  Currently depression well controlled.  Will place orders for initial dementia workup. Given patient's young age and family concerns, will refer to neurology for further workup - Ambulatory referral to Neurology - CT Head Wo Contrast; Future - CBC - Comprehensive metabolic panel - RPR - HIV antibody - Vitamin B12 - Folate - TSH

## 2017-02-20 NOTE — Assessment & Plan Note (Signed)
Chronic dry cough for several years in a patient with a 45-year smoking history and SOB with activity -most concerning for COPD-Unlikely reflux or postnasal drip given patient history. Does not have a history of an ACE on her medication list.  Given chest x-ray with normal cardiac size lack of leg swelling unlikely CHF, though patient undoubtedly has some arteriasclerosis with her history of hypertension, smoking, and diabetes -Given patient is a 30 pack year or more history of smoking-will start with a low-dose CT scan per recommendations given no red flags of weight loss or hemoptysis -We will start Spiriva for now and see if this helps -Will have patient follow-up with Dr. Valentina Lucks for pulmonary function testing -We will obtain a BNP x1 though likely negative

## 2017-02-26 ENCOUNTER — Encounter: Payer: Self-pay | Admitting: Internal Medicine

## 2017-02-27 ENCOUNTER — Telehealth: Payer: Self-pay

## 2017-02-27 ENCOUNTER — Ambulatory Visit (HOSPITAL_COMMUNITY): Payer: Medicare Other

## 2017-02-27 NOTE — Telephone Encounter (Signed)
Patient's sister, Dub Mikes, left message on nurse line. Patient is extremely ill. She has appts TODAY for two different CT scans and an appt with Dr Luberta Robertson? On 03/01/17 related to these scans.   These appts need to be canceled and rescheduled. Please call Dub Mikes at [phone]. Danley Danker, RN [facility] RN)

## 2017-02-27 NOTE — Telephone Encounter (Signed)
Spoke with pt sister, says pt has a cold. Gave her the number to call and reschedule the CT scans and she will call me back and let me know the dates so I can reschedule her appt with dr Vickii Chafe, CMA

## 2017-02-28 ENCOUNTER — Ambulatory Visit (INDEPENDENT_AMBULATORY_CARE_PROVIDER_SITE_OTHER): Payer: Medicare Other | Admitting: Internal Medicine

## 2017-02-28 ENCOUNTER — Other Ambulatory Visit: Payer: Self-pay

## 2017-02-28 ENCOUNTER — Encounter: Payer: Self-pay | Admitting: Internal Medicine

## 2017-02-28 DIAGNOSIS — R05 Cough: Secondary | ICD-10-CM

## 2017-02-28 DIAGNOSIS — R059 Cough, unspecified: Secondary | ICD-10-CM

## 2017-02-28 MED ORDER — AZITHROMYCIN 500 MG PO TABS: 500.0000 mg | ORAL_TABLET | Freq: Every day | ORAL | 0 refills | Status: DC

## 2017-02-28 MED ORDER — HYDROCODONE-HOMATROPINE 5-1.5 MG/5ML PO SYRP
5.0000 mL | ORAL_SOLUTION | Freq: Three times a day (TID) | ORAL | 0 refills | Status: DC | PRN
Start: 2017-02-28 — End: 2017-05-18

## 2017-02-28 NOTE — Patient Instructions (Addendum)
It was nice meeting you today Ms. Marie [name].   For cough, you can use the Hycodan cough syrup up to every 8 hours as needed. This will make you sleepy, so be careful when taking during the day.   Please begin taking the antibiotic (azithromycin) today. You will take one tablet a day for the next three days.   You can continue to take Tylenol for fevers or body aches.   If you are not getting better by the time you have completed the antibiotics, please let us know.   If you have any questions or concerns, please feel free to call the clinic.   Be well,  Dr. Avon Gully

## 2017-02-28 NOTE — Progress Notes (Signed)
   Subjective:   Patient: Marie [name]       Birthdate: 1955/04/04       MRN: [id number]      HPI  TYEASHA EBBS is a 62 y.o. female presenting for same day appt for cold symptoms.   Cold symptoms Began more than one week ago. Productive cough. No blood in sputum. Tmax 102F. Has been taking OTC meds including Dayquil and Nyquil with no improvement in symptoms. Tylenol has helped with fever. Drinking and eating well. No known sick contacts. Endorses sore throat. Denies vomiting, diarrhea, nausea, abd pain. Has chest pain when coughing. Difficulty sleeping at night due to coughing.   Smoking status reviewed. Patient is current every day smoker.   Review of Systems See HPI.     Objective:  Physical Exam  Constitutional: She is oriented to person, place, and time and well-developed, well-nourished, and in no distress.  HENT:  Head: Normocephalic and atraumatic.  Nose: Nose normal.  Mouth/Throat: Oropharynx is clear and moist. No oropharyngeal exudate.  Cardiovascular: Normal rate, regular rhythm and normal heart sounds.  No murmur heard. Pulmonary/Chest: Effort normal and breath sounds normal. No respiratory distress. She has no wheezes.  Coughing intermittently throughout exam  Neurological: She is alert and oriented to person, place, and time.  Skin: Skin is warm and dry.  Psychiatric: Affect and judgment normal.   Assessment & Plan:  Cough Cough for ~1wk with accompanying fever. Patient with normal WOB on RA and afebrile in office today. Lungs CTAB as well, however patient is current every day smoker and at increased risk of developing CAP. As such, will treat with azithro. As patient also with significant cough causing chest pain and inability to sleep at night, will prescribe Hycodan cough syrup for symptomatic relief. Strict return precautions discussed. Also encouraged smoking cessation and discussed possible worsening of various chronic respiratory issues if she  continues to smoke daily.    Adin Hector, MD, MPH PGY-3 [facility] Pager [phone]

## 2017-03-01 ENCOUNTER — Ambulatory Visit: Payer: Self-pay | Admitting: Pharmacist

## 2017-03-01 NOTE — Assessment & Plan Note (Signed)
Cough for ~1wk with accompanying fever. Patient with normal WOB on RA and afebrile in office today. Lungs CTAB as well, however patient is current every day smoker and at increased risk of developing CAP. As such, will treat with azithro. As patient also with significant cough causing chest pain and inability to sleep at night, will prescribe Hycodan cough syrup for symptomatic relief. Strict return precautions discussed. Also encouraged smoking cessation and discussed possible worsening of various chronic respiratory issues if she continues to smoke daily.

## 2017-03-05 ENCOUNTER — Encounter: Payer: Self-pay | Admitting: Internal Medicine

## 2017-03-05 ENCOUNTER — Other Ambulatory Visit: Payer: Self-pay

## 2017-03-05 ENCOUNTER — Ambulatory Visit (INDEPENDENT_AMBULATORY_CARE_PROVIDER_SITE_OTHER): Payer: Medicare Other | Admitting: Internal Medicine

## 2017-03-05 VITALS — BP 102/72 | HR 64 | Temp 98.2°F | Ht 64.0 in | Wt 210.8 lb

## 2017-03-05 DIAGNOSIS — R05 Cough: Secondary | ICD-10-CM | POA: Diagnosis not present

## 2017-03-05 DIAGNOSIS — R0683 Snoring: Secondary | ICD-10-CM

## 2017-03-05 DIAGNOSIS — E119 Type 2 diabetes mellitus without complications: Secondary | ICD-10-CM

## 2017-03-05 DIAGNOSIS — R413 Other amnesia: Secondary | ICD-10-CM

## 2017-03-05 DIAGNOSIS — K219 Gastro-esophageal reflux disease without esophagitis: Secondary | ICD-10-CM | POA: Insufficient documentation

## 2017-03-05 DIAGNOSIS — R059 Cough, unspecified: Secondary | ICD-10-CM

## 2017-03-05 MED ORDER — METFORMIN HCL 500 MG PO TABS: 500.0000 mg | ORAL_TABLET | Freq: Two times a day (BID) | ORAL | 3 refills | Status: DC

## 2017-03-05 MED ORDER — OMEPRAZOLE 20 MG PO CPDR
20.0000 mg | DELAYED_RELEASE_CAPSULE | Freq: Every day | ORAL | 0 refills | Status: DC
Start: 2017-03-05 — End: 2017-03-05

## 2017-03-05 MED ORDER — OMEPRAZOLE 20 MG PO CPDR: 20.0000 mg | DELAYED_RELEASE_CAPSULE | Freq: Every day | ORAL | 4 refills | Status: DC

## 2017-03-05 MED ORDER — METFORMIN HCL 500 MG PO TABS: 500.0000 mg | ORAL_TABLET | Freq: Two times a day (BID) | ORAL | 0 refills | Status: DC

## 2017-03-05 MED ORDER — OMEPRAZOLE 20 MG PO CPDR: 20.0000 mg | DELAYED_RELEASE_CAPSULE | Freq: Every day | ORAL | 0 refills | Status: DC

## 2017-03-05 NOTE — Patient Instructions (Addendum)
Patient has a referral to [facility] -  [phone]. Please give them a call to set up an appointment to be seen for her memory loss. please make sure to follow-up on the rest of the imaging we have discussed.   Please follow-up with me after the imaging has been completed and you have seen neurology.

## 2017-03-05 NOTE — Progress Notes (Signed)
[name] Marie Mo, MD Phone: [phone]  Reason For Visit: Follow up   Cough  -Has had significant improvement in her cough and shortness of breath given she is started using the Spiriva daily. -MMR C dyspnea scale of 1-therefore likely if patient has COPD that it is very mild at this point -Had to cancel appointment with Dr. Valentina Lucks for pluminary function because she has been sick recently  Memory Loss  Patient was supposed to follow-up for memory issues with CT scan and neurology Has not heard from [facility] for referral Provided them with a phone number of [facility] to get in contact and make an appointment  Sleep Apnea  -Patient is concerned that she has sleep apnea.  Her sister she does stop breathing in her sleep.  Patient also indicates snoring loudly.  Up-to-date on Flu shot/Pneumonia shot  ROS see HPI Smoking Status noted  Objective: BP 102/72   Pulse 64   Temp 98.2 F (36.8 C) (Oral)   Ht 5' 4" (1.626 m)   Wt 210 lb 12.8 oz (95.6 kg)   SpO2 97%   BMI 36.18 kg/m  Gen: NAD, alert, cooperative with exam Cardio: regular rate and rhythm, S1S2 heard, no murmurs appreciated Pulm: clear to auscultation bilaterally, no wheezes, rhonchi or rales Skin: dry, intact, no rashes or lesions  Assessment/Plan: See problem based a/p  Cough Lab work is all within normal limits, patient is improved significantly on Spiriva -Will continue Spiriva -Follow-up with Dr. Valentina Lucks for pulmonary function testing   Snoring STOPBANGS positive, concern for sleep apnea - Nocturnal polysomnography (NPSG); Future    Memory loss Has not yet been evaluated by neurology. Lab work within normal limits  Provided information on neurology referral to [facility]

## 2017-03-06 ENCOUNTER — Encounter: Payer: Self-pay | Admitting: Psychology

## 2017-03-06 NOTE — Assessment & Plan Note (Signed)
STOPBANGS positive, concern for sleep apnea - Nocturnal polysomnography (NPSG); Future

## 2017-03-06 NOTE — Assessment & Plan Note (Signed)
Has not yet been evaluated by neurology. Lab work within normal limits  Provided information on neurology referral to [facility]

## 2017-03-06 NOTE — Assessment & Plan Note (Signed)
Lab work is all within normal limits, patient is improved significantly on Spiriva -Will continue Spiriva -Follow-up with Dr. Valentina Lucks for pulmonary function testing

## 2017-03-09 ENCOUNTER — Other Ambulatory Visit: Payer: Self-pay | Admitting: Internal Medicine

## 2017-03-09 ENCOUNTER — Ambulatory Visit (HOSPITAL_COMMUNITY)
Admission: RE | Admit: 2017-03-09 | Discharge: 2017-03-09 | Disposition: A | Discharge status: Home / Self Care | Payer: Medicare Other | Source: Ambulatory Visit | Attending: Family Medicine | Admitting: Family Medicine

## 2017-03-09 DIAGNOSIS — R918 Other nonspecific abnormal finding of lung field: Secondary | ICD-10-CM | POA: Diagnosis not present

## 2017-03-09 DIAGNOSIS — R05 Cough: Secondary | ICD-10-CM

## 2017-03-09 DIAGNOSIS — R059 Cough, unspecified: Secondary | ICD-10-CM

## 2017-03-09 DIAGNOSIS — R413 Other amnesia: Secondary | ICD-10-CM | POA: Diagnosis not present

## 2017-03-09 DIAGNOSIS — Z87891 Personal history of nicotine dependence: Secondary | ICD-10-CM | POA: Insufficient documentation

## 2017-03-09 DIAGNOSIS — K760 Fatty (change of) liver, not elsewhere classified: Secondary | ICD-10-CM | POA: Diagnosis not present

## 2017-03-09 DIAGNOSIS — I700 Atherosclerosis of aorta: Secondary | ICD-10-CM | POA: Diagnosis not present

## 2017-03-09 DIAGNOSIS — I2510 Atherosclerotic heart disease of native coronary artery without angina pectoris: Secondary | ICD-10-CM | POA: Insufficient documentation

## 2017-03-09 DIAGNOSIS — R160 Hepatomegaly, not elsewhere classified: Secondary | ICD-10-CM | POA: Diagnosis not present

## 2017-03-19 ENCOUNTER — Telehealth: Payer: Self-pay

## 2017-03-19 NOTE — Telephone Encounter (Signed)
Pt called nurse line, would like to speak to MD about possible reaction to Spiriva. States she has had a rash that started around the time she started spiriva and is concerned she may be allergic. Would like MD's advice Call back [phone] Wallace Cullens, RN

## 2017-03-20 NOTE — Telephone Encounter (Signed)
Called patient leaving a message letting her know she needs to make an appointment and we can evaluate the rash at that appointment and discuss what it might be.

## 2017-03-21 ENCOUNTER — Other Ambulatory Visit: Payer: Self-pay | Admitting: Internal Medicine

## 2017-03-21 DIAGNOSIS — F411 Generalized anxiety disorder: Secondary | ICD-10-CM

## 2017-03-27 ENCOUNTER — Ambulatory Visit: Payer: Medicare Other | Admitting: Psychology

## 2017-03-27 ENCOUNTER — Encounter: Payer: Self-pay | Admitting: Psychology

## 2017-03-27 DIAGNOSIS — R413 Other amnesia: Secondary | ICD-10-CM

## 2017-03-27 DIAGNOSIS — F4312 Post-traumatic stress disorder, chronic: Secondary | ICD-10-CM

## 2017-03-27 DIAGNOSIS — F3341 Major depressive disorder, recurrent, in partial remission: Secondary | ICD-10-CM
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
## 2017-03-28 ENCOUNTER — Ambulatory Visit (INDEPENDENT_AMBULATORY_CARE_PROVIDER_SITE_OTHER): Payer: Medicare Other | Admitting: Psychology

## 2017-03-28 DIAGNOSIS — R413 Other amnesia: Secondary | ICD-10-CM

## 2017-03-28 NOTE — Progress Notes (Signed)
   Neuropsychology Note  Marie [name] completed 90 minutes of neuropsychological testing with technician, Milana Kidney, BS, under the supervision of Dr. Macarthur Critchley, Licensed Psychologist. The patient did not appear overtly distressed by the testing session, per behavioral observation or via self-report to the technician. Rest breaks were offered.   Clinical Decision Making: In considering the patient's current level of functioning, level of presumed impairment, nature of symptoms, emotional and behavioral responses during the interview, level of literacy, and observed level of motivation/effort, a battery of tests was selected and communicated to the psychometrician.  Communication between the psychologist and technician was ongoing throughout the testing session and changes were made as deemed necessary based on patient performance on testing, technician observations and additional pertinent factors such as those listed above.  Rica Koyanagi will return within approximately 2 weeks for an interactive feedback session with Dr. Si Raider at which time her test performances, clinical impressions and treatment recommendations will be reviewed in detail. The patient understands she can contact our office should she require our assistance before this time.  35 minutes spent performing neuropsychological evaluation services/clinical decision making (psychologist). [CPT 28366] 29 minutes spent face-to-face with patient administering standardized tests, 30 minutes spent scoring (technician). [CPT [id number], 47654]  Full report to follow.

## 2017-03-29 ENCOUNTER — Other Ambulatory Visit: Payer: Self-pay | Admitting: Internal Medicine

## 2017-03-29 DIAGNOSIS — E119 Type 2 diabetes mellitus without complications: Secondary | ICD-10-CM

## 2017-04-04 ENCOUNTER — Encounter: Payer: Self-pay | Admitting: Family Medicine

## 2017-04-04 ENCOUNTER — Ambulatory Visit (INDEPENDENT_AMBULATORY_CARE_PROVIDER_SITE_OTHER): Payer: Medicare Other | Admitting: Family Medicine

## 2017-04-04 ENCOUNTER — Other Ambulatory Visit: Payer: Self-pay

## 2017-04-04 VITALS — BP 110/70 | HR 65 | Temp 98.3°F | Ht 64.0 in | Wt 210.0 lb

## 2017-04-04 DIAGNOSIS — B86 Scabies: Secondary | ICD-10-CM | POA: Diagnosis not present

## 2017-04-04 MED ORDER — PERMETHRIN 1 % EX LOTN: 1.0000 "application " | TOPICAL_LOTION | Freq: Once | CUTANEOUS | 0 refills | Status: AC

## 2017-04-04 NOTE — Progress Notes (Signed)
   CC: rash  HPI  Rash: Patient complains of rash involving the abdomen, back, legs. Rash started 1-2 weeks ago. Started on belly, rep papules. Very itchy. Spread next to legs, arms. No one else has rash. Lives alone. Tried hydrocortisone, no help. No oral medicines. Never had something like this before. Patient has not had new exposures (soaps, lotions, laundry detergents, foods, medications, plants, insects or animals.) no new medicines.   ROS: Denies CP, SOB, abdominal pain, dysuria, changes in BMs.   CC, SH/smoking status, and VS noted  Objective: BP 110/70   Pulse 65   Temp 98.3 F (36.8 C) (Oral)   Ht 5\' 4"  (1.626 m)   Wt 210 lb (95.3 kg)   SpO2 95%   BMI 36.05 kg/m  Gen: NAD, alert, cooperative, and pleasant. HEENT: NCAT, EOMI, PERRL CV: RRR, no murmur Resp: CTAB, no wheezes, non-labored Abd: pinpoint scabbed red rash diffusely over abdomen, back, nonblanching, itchy.  Ext: No edema, warm. Intertriginous bite like rash between fingers and toes on all extremities.  Neuro: Alert and oriented, Speech clear, No gross deficits  Assessment and plan:  Rash: consistent with scabies. Unclear contact or source of infection. Counseled on washing linens in hot water and using lotion once and once again 1 week later. Continue PRN hydrocortisone or benadryl for diffuse itching.   Meds ordered this encounter  Medications  . permethrin (ELIMITE) 1 % lotion    Sig: Apply 1 application topically once for 1 dose. Re apply one week later if needed.    Dispense:  59 mL    Refill:  0     Ralene Ok, MD, PGY2 04/06/2017 9:55 AM

## 2017-04-04 NOTE — Patient Instructions (Addendum)
It was a pleasure to see you today! Thank you for choosing [facility] for your primary care. Marie [name] was seen for rash.   Our plans for today were:  Use the cream I sent - apply it from the neck down before bed, sleep with it on (DO NOT Golden OFF), then wash it off in the morning.   Wash your items as below.   Scabies, Adult Scabies is a skin condition that happens when very small insects get under the skin (infestation). This causes a rash and severe itchiness. Scabies can spread from person to person (is contagious). If you get scabies, it is common for others in your household to get scabies too. With proper treatment, symptoms usually go away in 2-4 weeks. Scabies usually does not cause lasting problems. What are the causes? This condition is caused by mites (Sarcoptes scabiei, or human itch mites) that can only be seen with a microscope. The mites get into the top layer of skin and lay eggs. Scabies can spread from person to person through:  Close contact with a person who has scabies.  Contact with infested items, such as towels, bedding, or clothing.  What increases the risk? This condition is more likely to develop in:  People who live in nursing homes and other extended-care facilities.  People who have sexual contact with a partner who has scabies.  Young children who attend child care facilities.  People who care for others who are at increased risk for scabies.  What are the signs or symptoms? Symptoms of this condition may include:  Severe itchiness. This is often worse at night.  A rash that includes tiny red bumps or blisters. The rash commonly occurs on the wrist, elbow, armpit, fingers, waist, groin, or buttocks. Bumps may form a line (burrow) in some areas.  Skin irritation. This can include scaly patches or sores.  How is this diagnosed? This condition is diagnosed with a physical exam. Your health care provider will look closely at your  skin. In some cases, your health care provider may take a sample of your affected skin (skin scraping) and have it examined under a microscope. How is this treated? This condition may be treated with:  Medicated cream or lotion that kills the mites. This is spread on the entire body and left on for several hours. Usually, one treatment with medicated cream or lotion is enough to kill all of the mites. In severe cases, the treatment may be repeated.  Medicated cream that relieves itching.  Medicines that help to relieve itching.  Medicines that kill the mites. This treatment is rarely used.  Follow these instructions at home:  Medicines  Take or apply over-the-counter and prescription medicines as told by your health care provider.  Apply medicated cream or lotion as told by your health care provider.  Do not wash off the medicated cream or lotion until the necessary amount of time has passed. Skin Care  Avoid scratching your affected skin.  Keep your fingernails closely trimmed to reduce injury from scratching.  Take cool baths or apply cool washcloths to help reduce itching. General instructions  Clean all items that you recently had contact with, including bedding, clothing, and furniture. Do this on the same day that your treatment starts. ? Use hot water when you wash items. ? Place unwashable items into closed, airtight plastic bags for at least 3 days. The mites cannot live for more than 3 days away from human skin. ?  Vacuum furniture and mattresses that you use.  Make sure that other people who may have been infested are examined by a health care provider. These include members of your household and anyone who may have had contact with infested items.  Keep all follow-up visits as told by your health care provider. This is important. Contact a health care provider if:  You have itching that does not go away after 4 weeks of treatment.  You continue to develop new  bumps or burrows.  You have redness, swelling, or pain in your rash area after treatment.  You have fluid, blood, or pus coming from your rash. This information is not intended to replace advice given to you by your health care provider. Make sure you discuss any questions you have with your health care provider. Document Released: 09/09/2014 Document Revised: 05/27/2015 Document Reviewed: 07/21/2014 Elsevier Interactive Patient Education  [employer].
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
## 2017-04-16 ENCOUNTER — Other Ambulatory Visit: Payer: Self-pay | Admitting: Internal Medicine

## 2017-04-16 DIAGNOSIS — Z1231 Encounter for screening mammogram for malignant neoplasm of breast: Secondary | ICD-10-CM

## 2017-04-17 ENCOUNTER — Other Ambulatory Visit: Payer: Self-pay | Admitting: Internal Medicine

## 2017-04-17 ENCOUNTER — Encounter: Payer: Self-pay | Admitting: Psychology

## 2017-04-17 ENCOUNTER — Ambulatory Visit (INDEPENDENT_AMBULATORY_CARE_PROVIDER_SITE_OTHER): Payer: Medicare Other | Admitting: Psychology

## 2017-04-17 ENCOUNTER — Encounter: Payer: Self-pay | Admitting: Neurology

## 2017-04-17 DIAGNOSIS — F0390 Unspecified dementia without behavioral disturbance: Secondary | ICD-10-CM

## 2017-04-17 DIAGNOSIS — E118 Type 2 diabetes mellitus with unspecified complications: Secondary | ICD-10-CM

## 2017-04-17 NOTE — Patient Instructions (Signed)
I am recommending that your PCP: 1. Consider MRI of the brain 2. Sleep study to evaluate for sleep apnea. Untreated sleep apnea can contribute to memory problems. 3. Medication review of what/how medication are being taken 4. Depending on sleep study results, medication review, and MRI results, medication may be considered for memory loss. This does not treat or cure the problem but the hope is that it will slow down decline/progression of the disease.  I am recommending that your family:  1. Have an understanding of and regularly monitor your medications to ensure errors are not made secondary to memory impairment 2. Continue assistance with finances/bills and appointments 3. Help you with advance care planning including POA for finances and healthcare, living will, etc. 4. Monitor your driving - If you have any more episodes of getting lost or not knowing how to get to a familiar location, you should stop driving.  I am recommending that you:  1. Have optimal control of vascular risk factors (eg diabetes), as this can worsen cognitive functioning and increase rate of decline. Specifically, you are encouraged to engage in regular monitoring of blood sugars, adhere to diabetic dietary recommendations, stop smoking, and get regular safe cardiovascular exercise (eg daily walking). 2. Limit your driving to familiar/local areas during the day.  3. Keep your doctor/family updated regarding current mood and if your depression comes back 4. See one of our neurologists to discuss medication for memory loss. 5. See me for neurocognitive re-evaluation in 1-2 years.

## 2017-04-21 ENCOUNTER — Encounter: Payer: Self-pay | Admitting: Internal Medicine

## 2017-04-21 ENCOUNTER — Telehealth: Payer: Self-pay | Admitting: Internal Medicine

## 2017-04-21 NOTE — Telephone Encounter (Signed)
Please call patient and let her know she needs an appointment in next couple of weeks. Thanks Daneen Volcy

## 2017-04-24 NOTE — Telephone Encounter (Signed)
Called patient to schedule an appointment per Dr. Emmaline Life and there was no answer. Voice mail was left asking patient to make an appointment.Marie [name], CMA '

## 2017-05-04 ENCOUNTER — Ambulatory Visit: Payer: Medicare Other | Admitting: Internal Medicine

## 2017-05-10 ENCOUNTER — Telehealth: Payer: Self-pay

## 2017-05-11 ENCOUNTER — Ambulatory Visit
Admission: RE | Admit: 2017-05-11 | Discharge: 2017-05-11 | Disposition: A | Discharge status: Home / Self Care | Payer: Medicare Other | Source: Ambulatory Visit | Attending: Family Medicine | Admitting: Family Medicine

## 2017-05-11 DIAGNOSIS — Z1231 Encounter for screening mammogram for malignant neoplasm of breast: Secondary | ICD-10-CM

## 2017-05-14 ENCOUNTER — Other Ambulatory Visit: Payer: Self-pay | Admitting: Family Medicine

## 2017-05-14 DIAGNOSIS — R928 Other abnormal and inconclusive findings on diagnostic imaging of breast: Secondary | ICD-10-CM

## 2017-05-17 ENCOUNTER — Telehealth: Payer: Self-pay

## 2017-05-17 NOTE — Telephone Encounter (Addendum)
Received fax from [facility] for Lidocaine 5% ointment and Omega 3 Acid Ethyl Esters, and Cyclobenzaprine 7.5mg , none of which are on medication list.   Danley Danker, RN [facility] RN)

## 2017-05-17 NOTE — Telephone Encounter (Signed)
Apparently Fraudulent request per Michelle's note previously

## 2017-05-18 ENCOUNTER — Encounter: Payer: Self-pay | Admitting: Internal Medicine

## 2017-05-18 ENCOUNTER — Ambulatory Visit (INDEPENDENT_AMBULATORY_CARE_PROVIDER_SITE_OTHER): Payer: Medicare Other | Admitting: Internal Medicine

## 2017-05-18 ENCOUNTER — Ambulatory Visit: Payer: Medicare Other

## 2017-05-18 ENCOUNTER — Ambulatory Visit
Admission: RE | Admit: 2017-05-18 | Discharge: 2017-05-18 | Disposition: A | Discharge status: Home / Self Care | Payer: Medicare Other | Source: Ambulatory Visit | Attending: Family Medicine | Admitting: Family Medicine

## 2017-05-18 VITALS — BP 120/70 | HR 71 | Temp 98.5°F | Ht 64.0 in | Wt 211.6 lb

## 2017-05-18 DIAGNOSIS — R059 Cough, unspecified: Secondary | ICD-10-CM

## 2017-05-18 DIAGNOSIS — R413 Other amnesia: Secondary | ICD-10-CM

## 2017-05-18 DIAGNOSIS — R05 Cough: Secondary | ICD-10-CM | POA: Diagnosis not present

## 2017-05-18 DIAGNOSIS — R0683 Snoring: Secondary | ICD-10-CM

## 2017-05-18 DIAGNOSIS — E119 Type 2 diabetes mellitus without complications: Secondary | ICD-10-CM

## 2017-05-18 DIAGNOSIS — R911 Solitary pulmonary nodule: Secondary | ICD-10-CM | POA: Diagnosis not present

## 2017-05-18 DIAGNOSIS — R922 Inconclusive mammogram: Secondary | ICD-10-CM | POA: Diagnosis not present

## 2017-05-18 DIAGNOSIS — R0602 Shortness of breath: Secondary | ICD-10-CM | POA: Diagnosis not present

## 2017-05-18 DIAGNOSIS — R928 Other abnormal and inconclusive findings on diagnostic imaging of breast: Secondary | ICD-10-CM

## 2017-05-18 MED ORDER — TIOTROPIUM BROMIDE MONOHYDRATE 18 MCG IN CAPS: 18.0000 ug | ORAL_CAPSULE | Freq: Every day | RESPIRATORY_TRACT | 12 refills | Status: AC

## 2017-05-18 NOTE — Assessment & Plan Note (Signed)
Using patient's medications as family concerned that medications could be causing some of the problem with memory loss.  We will completely stop metformin today. -Will obtain brain MRI and sleep study -Follow-up with neurology

## 2017-05-18 NOTE — Assessment & Plan Note (Signed)
Repeat CT chest

## 2017-05-18 NOTE — Patient Instructions (Addendum)
I have placed orders for your Chest CT, Head MRI, and Sleep study. Please follow up for well women in August.

## 2017-05-18 NOTE — Progress Notes (Signed)
   [facility] Kerrin Mo, MD Phone: [phone]  Reason For Visit: Follow up   #Cough/Pulmonary nodule Patient's cough has improved significantly she is still taking the Spiriva she does not take it daily but takes it every once in a while.  She feels overall a lot better.  She did have a chest CT which was positive for a 6 mm pulmonary nodule with recommendation for follow-up CT  in 3 to 6 months.  # Memory Loss -Patient was evaluated by neuropsychology which feels she has significant memory loss probably consistent with dementia.  Family is concerned about possible medications. We have tried to decrease the medications patient is taking significantly including metformin to see if there is any improvement with her memory off of these medications.  Neuropsychology recommended a sleep study and an MRI brain.  Patient to follow-up with neurology in June.   Past Medical History Reviewed problem list.  Medications- reviewed and updated No additions to family history Social history- patient is a non-smoker  Objective: BP 120/70 (BP Location: Left Arm, Patient Position: Sitting, Cuff Size: Normal)   Pulse 71   Temp 98.5 F (36.9 C) (Oral)   Ht 5\' 4"  (1.626 m)   Wt 211 lb 9.6 oz (96 kg)   SpO2 96%   BMI 36.32 kg/m  Gen: NAD, alert, cooperative with exam Cardio: regular rate and rhythm, S1S2 heard, no murmurs appreciated Pulm: clear to auscultation bilaterally, no wheezes, rhonchi or rales Skin: dry, intact, no rashes or lesions    Assessment/Plan: See problem based a/p  Cough Improved, Recent CT positive for 6 mm nodule - will need a repeat test this month  - tiotropium (SPIRIVA) 18 MCG inhalation capsule; Place 1 capsule (18 mcg total) into inhaler and inhale daily.  Dispense: 30 capsule; Refill: 12 - CT Chest Wo Contrast  Pulmonary nodule Repeat CT chest   Memory loss Using patient's medications as family concerned that medications could be  causing some of the problem with memory loss.  We will completely stop metformin today. -Will obtain brain MRI and sleep study -Follow-up with neurology  Type 2 diabetes mellitus Metformin stopped Will need an A1c at next appointment

## 2017-05-18 NOTE — Assessment & Plan Note (Signed)
Metformin stopped Will need an A1c at next appointment

## 2017-05-18 NOTE — Assessment & Plan Note (Signed)
Improved, Recent CT positive for 6 mm nodule - will need a repeat test this month  - tiotropium (SPIRIVA) 18 MCG inhalation capsule; Place 1 capsule (18 mcg total) into inhaler and inhale daily.  Dispense: 30 capsule; Refill: 12 - CT Chest Wo Contrast

## 2017-05-19 LAB — BASIC METABOLIC PANEL
BUN/Creatinine Ratio: 16 (ref 12–28)
BUN: 8 mg/dL (ref 8–27)
CALCIUM: 9.7 mg/dL (ref 8.7–10.3)
CHLORIDE: 100 mmol/L (ref 96–106)
CO2: 23 mmol/L (ref 20–29)
Creatinine, Ser: 0.51 mg/dL — ABNORMAL LOW (ref 0.57–1.00)
GFR, EST AFRICAN AMERICAN: 121 mL/min/{1.73_m2} (ref >59)
GFR, EST NON AFRICAN AMERICAN: 105 mL/min/{1.73_m2} (ref >59)
Glucose: 137 mg/dL — ABNORMAL HIGH (ref 65–99)
Potassium: 4.6 mmol/L (ref 3.5–5.2)
Sodium: 138 mmol/L (ref 134–144)

## 2017-05-22 ENCOUNTER — Other Ambulatory Visit: Payer: Self-pay | Admitting: Family Medicine

## 2017-05-24 ENCOUNTER — Other Ambulatory Visit (HOSPITAL_COMMUNITY): Payer: Medicare Other

## 2017-05-24 ENCOUNTER — Ambulatory Visit (HOSPITAL_COMMUNITY): Payer: Medicare Other

## 2017-05-25 ENCOUNTER — Ambulatory Visit (HOSPITAL_COMMUNITY)
Admission: RE | Admit: 2017-05-25 | Discharge: 2017-05-25 | Disposition: A | Discharge status: Home / Self Care | Payer: Medicare Other | Source: Ambulatory Visit | Attending: Family Medicine | Admitting: Family Medicine

## 2017-05-25 DIAGNOSIS — I2510 Atherosclerotic heart disease of native coronary artery without angina pectoris: Secondary | ICD-10-CM | POA: Diagnosis not present

## 2017-05-25 DIAGNOSIS — I700 Atherosclerosis of aorta: Secondary | ICD-10-CM | POA: Diagnosis not present

## 2017-05-25 DIAGNOSIS — R413 Other amnesia: Secondary | ICD-10-CM | POA: Diagnosis not present

## 2017-05-25 DIAGNOSIS — G319 Degenerative disease of nervous system, unspecified: Secondary | ICD-10-CM | POA: Insufficient documentation

## 2017-05-25 DIAGNOSIS — R911 Solitary pulmonary nodule: Secondary | ICD-10-CM | POA: Diagnosis not present

## 2017-05-25 MED ORDER — GADOBENATE DIMEGLUMINE 529 MG/ML IV SOLN
20.0000 mL | Freq: Once | INTRAVENOUS | Status: AC | PRN
  Administered 2017-05-25: 20 mL via INTRAVENOUS

## 2017-05-30 ENCOUNTER — Other Ambulatory Visit: Payer: Self-pay | Admitting: Family Medicine

## 2017-05-31 NOTE — Telephone Encounter (Signed)
Mistake.

## 2017-06-01 ENCOUNTER — Telehealth: Payer: Self-pay

## 2017-06-01 NOTE — Telephone Encounter (Signed)
Called and left a message concerning the results.

## 2017-06-01 NOTE — Telephone Encounter (Signed)
Called sister back - told her I do recommend follow up with neurology

## 2017-06-01 NOTE — Telephone Encounter (Signed)
Joaquim Lai- PT sister called again, states she got the message about pt's results, and wanted to know does the patient still need to see neurologist in July. Call back number [phone] Wallace Cullens, RN

## 2017-06-01 NOTE — Telephone Encounter (Signed)
Pt's sister Joaquim Lai calling for CT and MRI results. Call back number [phone] Wallace Cullens, RN

## 2017-06-06 ENCOUNTER — Other Ambulatory Visit: Payer: Self-pay

## 2017-06-17 ENCOUNTER — Encounter (HOSPITAL_BASED_OUTPATIENT_CLINIC_OR_DEPARTMENT_OTHER): Payer: Medicare Other

## 2017-06-22 ENCOUNTER — Other Ambulatory Visit: Payer: Self-pay

## 2017-06-23 ENCOUNTER — Ambulatory Visit (HOSPITAL_BASED_OUTPATIENT_CLINIC_OR_DEPARTMENT_OTHER): Payer: Medicare Other | Attending: Family Medicine | Admitting: Internal Medicine

## 2017-06-23 VITALS — Ht 64.0 in | Wt 206.0 lb

## 2017-06-23 DIAGNOSIS — R413 Other amnesia: Secondary | ICD-10-CM | POA: Diagnosis present

## 2017-06-23 DIAGNOSIS — G4733 Obstructive sleep apnea (adult) (pediatric): Secondary | ICD-10-CM | POA: Insufficient documentation

## 2017-07-07 DIAGNOSIS — G4733 Obstructive sleep apnea (adult) (pediatric): Secondary | ICD-10-CM | POA: Diagnosis not present

## 2017-07-07 NOTE — Procedures (Signed)
  Patient Name: Marie [name], Marie [name] Date: 06/23/2017 Gender: Female D.O.B: 03-Dec-1955 Age (years): 62 Referring Provider: Madison Hickman Height (inches): 24 Interpreting Physician: Baird Lyons MD, ABSM Weight (lbs): 206 RPSGT: Earney Hamburg BMI: 35 MRN: [id number] Neck Size: 16.00  CLINICAL INFORMATION Sleep Study Type: NPSG Indication for sleep study: Memory Loss Epworth Sleepiness Score: 3  SLEEP STUDY TECHNIQUE As per the AASM Manual for the Scoring of Sleep and Associated Events v2.3 (April 2016) with a hypopnea requiring 4% desaturations.  The channels recorded and monitored were frontal, central and occipital EEG, electrooculogram (EOG), submentalis EMG (chin), nasal and oral airflow, thoracic and abdominal wall motion, anterior tibialis EMG, snore microphone, electrocardiogram, and pulse oximetry.  MEDICATIONS Medications self-administered by patient taken the night of the study : none reported  SLEEP ARCHITECTURE The study was initiated at 9:22:54 PM and ended at 4:44:55 AM.  Sleep onset time was 22.3 minutes and the sleep efficiency was 71.9%%. The total sleep time was 318 minutes.  Stage REM latency was 350.0 minutes.  The patient spent 17.8%% of the night in stage N1 sleep, 75.5%% in stage N2 sleep, 0.0%% in stage N3 and 6.76% in REM.  Alpha intrusion was absent.  Supine sleep was 24.76%.  RESPIRATORY PARAMETERS The overall apnea/hypopnea index (AHI) was 9.1 per hour. There were 1 total apneas, including 1 obstructive, 0 central and 0 mixed apneas. There were 47 hypopneas and 37 RERAs.  The AHI during Stage REM sleep was 8.4 per hour.  AHI while supine was 14.5 per hour.  The mean oxygen saturation was 91.6%. The minimum SpO2 during sleep was 80.0%.  loud snoring was noted during this study.  CARDIAC DATA The 2 lead EKG demonstrated sinus rhythm. The mean heart rate was 62.8 beats per minute. Other EKG findings include: None.  LEG MOVEMENT  DATA The total PLMS were 0 with a resulting PLMS index of 0.0. Associated arousal with leg movement index was 2.1 .  IMPRESSIONS - Mild obstructive sleep apnea occurred during this study (AHI = 9.1/h). - No significant central sleep apnea occurred during this study (CAI = 0.0/h). - Oxygen desaturation was noted during this study (Min O2 = 80.0%). Mean 91.6% - The patient snored with loud snoring volume. - No cardiac abnormalities were noted during this study. - Clinically significant periodic limb movements did not occur during sleep. No significant associated arousals.  DIAGNOSIS - Obstructive Sleep Apnea (327.23 [G47.33 ICD-10])  RECOMMENDATIONS - Treatment for mild OSA is directed at symptoms. Conservative options might include observation, weight loss or sleep position off back. Other options including CPAP or a fitted oral appliance might be more appropriate, based on clinical judgment. - Be careful with alcohol, sedatives and other CNS depressants that may worsen sleep apnea and disrupt normal sleep architecture. - Sleep hygiene should be reviewed to assess factors that may improve sleep quality. - Weight management and regular exercise should be initiated or continued if appropriate.  [Electronically signed] 07/07/2017 03:01 PM  Baird Lyons MD, [place], American Board of Sleep Medicine   NPI: [id number]                           [facility] [place] [facility]  ELECTRONICALLY SIGNED ON:  07/07/2017, 2:48 PM [place] PH: [phone]   FX: [phone] [place]

## 2017-07-09 ENCOUNTER — Telehealth: Payer: Self-pay | Admitting: Family Medicine

## 2017-07-09 NOTE — Telephone Encounter (Signed)
-----   Message from Zenia Resides, MD sent at 07/09/2017  9:15 AM EDT ----- Tawanna Solo, What do I do with this result?  Ordered by [employer].  No PCP assigned and no currently scheduled appointment.Rush [place]mark ----- Message ----- From: Deneise Lever, MD Sent: 07/07/2017   3:04 PM To: Zenia Resides, MD

## 2017-07-09 NOTE — Telephone Encounter (Signed)
Sleep study discussed with patient's sister Lucilla Edin per DPR.   Mild OSA Recommendation: Conservative vs CPAP. Sister will schedule appointment this week or the next to discuss Plan. I also advised her that her new PCP is Dr. Dorris Singh who will not get here till August. She will also schedule appointment to meet new PCP in Aug.  Sis verbalized understanding and will follow-up as recommended. She denies any concern at the moment.

## 2017-07-10 ENCOUNTER — Encounter: Payer: Self-pay | Admitting: Neurology

## 2017-07-10 ENCOUNTER — Ambulatory Visit: Payer: Medicare Other | Admitting: Neurology

## 2017-07-10 ENCOUNTER — Telehealth: Payer: Self-pay | Admitting: Psychology

## 2017-07-10 ENCOUNTER — Other Ambulatory Visit: Payer: Self-pay

## 2017-07-10 VITALS — BP 132/84 | HR 71 | Ht 64.0 in | Wt 212.0 lb

## 2017-07-10 DIAGNOSIS — F0390 Unspecified dementia without behavioral disturbance: Secondary | ICD-10-CM

## 2017-07-10 NOTE — Telephone Encounter (Signed)
Great thank you. I called and made her aware. Thanks

## 2017-07-10 NOTE — Progress Notes (Signed)
NEUROLOGY CONSULTATION NOTE  [name] MRN: [id number] DOB: 04-Sep-1955  Referring provider: Dr. Macarthur Critchley Primary care provider: [facility]  Reason for consult:  dementia  Dear Dr Si Raider:  Thank you for your kind referral of Marie [name] for consultation of the above symptoms. Although her history is well known to you, please allow me to reiterate it for the purpose of our medical record. The patient was accompanied to the clinic by her significant other, Marie [name], and sister who also provide collateral information. Records and images were personally reviewed where available.  HISTORY OF PRESENT ILLNESS: This is a pleasant 62 year old right-handed woman with a history of hypertension, diabetes, migraines, PTSD, anxiety, presenting for evaluation of dementia. She underwent Neuropsychological testing last April 2019 with Dr. Si Raider, records were reviewed. Family had reported memory issues progressively worsening over a period of 2 years. Initially it was felt to be related to depression, but despite improvement in mood, memory issues worsened. One of her sisters was concerned that either metformin or gabapentin were causing the cognitive changes. Concern for sleep apnea was also raised. Neurocognitive testing indicated attenuated cognitive functioning globally, but her cognitive profile of relative strengths and weaknesses was concerning for early onset Alzheimer's disease. It was also noted she has a history of psychological trauma at a young age and significant depression and PTSD, and pseudodementia related to mood and anxiety disorders was considered. However, she did not present with overt signs or symptoms of depression or anxiety, and her family reported her mood has actually been quite improved lately.   Since her Neuropsychological testing, she has had an MRI brain with and without contrast done 05/25/17 which I personally reviewed, no acute changes, mild diffuse atrophy  and chronic microvascular disease. She had a sleep study done last month showing mild obstructive sleep apnea. Since her testing, she has moved in with her significant other living in [place]. He reports that he has known her for more than 40 years, they lost touch and reconnected in February. She moved to [facility] in April. He reports that she was forgetful when they first reconnected, but since then he has seen a lot of improvement, which they attribute to stopping a lot of her medications. Her sister had reported previously that she would not know what day it was or where she was, she was getting lost driving, missing bill payments that family had to take over. He reports that she drives around [facility] without getting lost. He checks behind her when taking medications and sees she does them fine. They deny any personality or mood changes, no paranoia or hallucinations. They report that even her mother has noted an improvement. Her sister has noticed things seem better, she has a better mindset.  She has low back pain. She denies any headaches, dizziness, vision changes, neck pain, focal numbness/tingling/weakness, bowel/bladder dysfunction, anosmia, or tremors. She reports that she stopped working 10 years ago after she had a nervous breakdown. Her maternal grandmother had dementia in her late 63s. No history of significant head injuries or alcohol use.  Laboratory Data: Lab Results  Component Value Date   TSH 1.600 02/19/2017   Lab Results  Component Value Date   VITAMINB12 289 02/19/2017     PAST MEDICAL HISTORY: Past Medical History:  Diagnosis Date  . Anxiety   . COPD (chronic obstructive pulmonary disease) ([place]) 2011  . Depression 2008  . Diabetes mellitus without complication ([place])   . GERD (  gastroesophageal reflux disease) 1994  . Hypertension   . Kidney stone   . Migraine 2008  . PTSD (post-traumatic stress disorder)     PAST SURGICAL HISTORY: Past Surgical  History:  Procedure Laterality Date  . APPENDECTOMY  1975  . BREAST CYST EXCISION Bilateral   . CARPAL TUNNEL RELEASE    . CESAREAN SECTION  1994  . KNEE ARTHROSCOPY Right   . KNEE SURGERY Right 2012   meniscal tear repair  . TONSILLECTOMY      MEDICATIONS: Current Outpatient Medications on File Prior to Visit  Medication Sig Dispense Refill  . albuterol (PROVENTIL HFA;VENTOLIN HFA) 108 (90 Base) MCG/ACT inhaler Inhale 2 puffs into the lungs every 6 (six) hours as needed for wheezing or shortness of breath. 1 Inhaler 2  . atenolol (TENORMIN) 50 MG tablet TAKE ONE TABLET TWICE DAILY 180 tablet 4  . Blood Glucose Monitoring Suppl (ONE TOUCH ULTRA 2) W/DEVICE KIT 1 Device by Does not apply route daily. 1 each 0  . cholecalciferol (VITAMIN D) 400 units TABS tablet Take 400 Units by mouth.    . co-enzyme Q-10 30 MG capsule Take 30 mg by mouth 3 (three) times daily.    . Collagen-Vitamin C (COLLAGEN PLUS VITAMIN C PO) Take 1,000 mg by mouth.    . Multiple Vitamin (MULTIVITAMIN) capsule Take 1 capsule by mouth daily.    . Omega-3 Fatty Acids (FISH OIL PO) Take 1 capsule by mouth daily.    . pravastatin (PRAVACHOL) 20 MG tablet Take 1 tablet (20 mg total) by mouth daily. 90 tablet 3  . tiotropium (SPIRIVA) 18 MCG inhalation capsule Place 1 capsule (18 mcg total) into inhaler and inhale daily. 30 capsule 12  . venlafaxine XR (EFFEXOR-XR) 75 MG 24 hr capsule TAKE 3 CAPSULES BY MOUTH EVERY MORNING 90 capsule 11   No current facility-administered medications on file prior to visit.     ALLERGIES: Allergies  Allergen Reactions  . Statins Other (See Comments)    Reaction: lipitor, zocor - muscle pain, ache  . Amoxicillin Diarrhea and Other (See Comments)    Has patient had a PCN reaction causing immediate rash, facial/tongue/throat swelling, SOB or lightheadedness with hypotension: No Has patient had a PCN reaction causing severe rash involving mucus membranes or skin necrosis: No Has patient  had a PCN reaction that required hospitalization No     Upset stomach Has patient had a PCN reaction occurring within the last 10 years: No If all of the above answers are "NO", then may proceed with Cephalosporin use.   . Fibrates Other (See Comments)    Cramps  . Sulfa Antibiotics Other (See Comments)    Reaction: unknown, but not hypersensitivity    FAMILY HISTORY: Family History  Problem Relation Age of Onset  . Colon cancer Mother 62  . Hyperlipidemia Mother   . Cancer Sister        breast  . Diabetes Sister   . Hyperlipidemia Sister   . Bladder Cancer Father     SOCIAL HISTORY: Social History   Socioeconomic History  . Marital status: Divorced    Spouse name: Not on file  . Number of children: Not on file  . Years of education: Not on file  . Highest education level: Not on file  Occupational History  . Not on file  Social Needs  . Financial resource strain: Not on file  . Food insecurity:    Worry: Not on file    Inability: Not on file  .  Transportation needs:    Medical: Not on file    Non-medical: Not on file  Tobacco Use  . Smoking status: Current Every Day Smoker    Packs/day: 1.00    Types: Cigarettes  . Smokeless tobacco: Never Used  Substance and Sexual Activity  . Alcohol use: Yes    Comment: on ocassion has "liquor"  . Drug use: No  . Sexual activity: Never  Lifestyle  . Physical activity:    Days per week: Not on file    Minutes per session: Not on file  . Stress: Not on file  Relationships  . Social connections:    Talks on phone: Not on file    Gets together: Not on file    Attends religious service: Not on file    Active member of club or organization: Not on file    Attends meetings of clubs or organizations: Not on file    Relationship status: Not on file  . Intimate partner violence:    Fear of current or ex partner: Not on file    Emotionally abused: Not on file    Physically abused: Not on file    Forced sexual activity: Not  on file  Other Topics Concern  . Not on file  Social History Narrative  . Not on file    REVIEW OF SYSTEMS: Constitutional: No fevers, chills, or sweats, no generalized fatigue, change in appetite Eyes: No visual changes, double vision, eye pain Ear, nose and throat: No hearing loss, ear pain, nasal congestion, sore throat Cardiovascular: No chest pain, palpitations Respiratory:  No shortness of breath at rest or with exertion, wheezes GastrointestinaI: No nausea, vomiting, diarrhea, abdominal pain, fecal incontinence Genitourinary:  No dysuria, urinary retention or frequency Musculoskeletal:  No neck pain, +back pain Integumentary: No rash, pruritus, skin lesions Neurological: as above Psychiatric: No depression, insomnia, anxiety Endocrine: No palpitations, fatigue, diaphoresis, mood swings, change in appetite, change in weight, increased thirst Hematologic/Lymphatic:  No anemia, purpura, petechiae. Allergic/Immunologic: no itchy/runny eyes, nasal congestion, recent allergic reactions, rashes  PHYSICAL EXAM: Vitals:   07/10/17 1049  BP: 132/84  Pulse: 71  SpO2: 94%   General: No acute distress Head:  Normocephalic/atraumatic Eyes: Fundoscopic exam shows bilateral sharp discs, no vessel changes, exudates, or hemorrhages Neck: supple, no paraspinal tenderness, full range of motion Back: No paraspinal tenderness Heart: regular rate and rhythm Lungs: Clear to auscultation bilaterally. Vascular: No carotid bruits. Skin/Extremities: No rash, no edema Neurological Exam: Mental status: alert and oriented to person, place, and time, no dysarthria or aphasia, Fund of knowledge is appropriate.  Remote memory intact.  Attention and concentration are normal.    Able to name objects and repeat phrases. CDT 4/5. Able to name 13 words starting with F in 1 minute (nl >11). MMSE - Mini Mental State Exam 07/10/2017  Orientation to time 4  Orientation to Place 5  Registration 3  Attention/  Calculation 5  Recall 0  Language- name 2 objects 2  Language- repeat 1  Language- follow 3 step command 3  Language- read & follow direction 1  Write a sentence 1  Copy design 0  Total score 25   Cranial nerves: CN I: not tested CN II: pupils equal, round and reactive to light, visual fields intact, fundi unremarkable. CN III, IV, VI:  full range of motion, no nystagmus, no ptosis CN V: facial sensation intact CN VII: upper and lower face symmetric CN VIII: hearing intact to finger rub CN IX, X:  gag intact, uvula midline CN XI: sternocleidomastoid and trapezius muscles intact CN XII: tongue midline Bulk & Tone: normal, no fasciculations. Motor: 5/5 throughout with no pronator drift. Sensation: intact to light touch, cold, pin, vibration and joint position sense.  No extinction to double simultaneous stimulation.  Romberg test negative Deep Tendon Reflexes: +2 throughout, no ankle clonus Plantar responses: downgoing bilaterally Cerebellar: no incoordination on finger to nose, heel to shin. No dysdiadochokinesia Gait: narrow-based and steady, able to tandem walk adequately. Tremor: none  IMPRESSION: This is a pleasant 63 year old right-handed woman with a history of hypertension, diabetes, migraines, PTSD, anxiety, presenting for evaluation of dementia. She underwent Neuropsychological testing last April 2019 with Dr. Si Raider with testing indicating findings concerning for early onset Alzheimer's disease. MRI brain no acute changes, mild diffuse atrophy and chronic microvascular disease. Since moving in with Marie [name] last May, they report an improvement in her cognition. She has been driving around [facility] with no difficulties. MMSE today 25/30. We had an extensive discussion about Neuropsychological results, and have agreed to continue to monitor her symptoms and hold off on medications such as Donepezil for now. Marie [name] was instructed to closely monitor medications and driving. Sleep  study had shown mild OSA, follow-up with PCP/Sleep Medicine. She will be scheduled for repeat Neuropsychological testing in 1 year to assess for interval change, and follow-up in our office in 6 months. Family knows to call for any changes.   Thank you for allowing me to participate in the care of this patient. Please do not hesitate to call for any questions or concerns.   Ellouise Newer, M.D.  CC: Dr. Si Raider

## 2017-07-10 NOTE — Patient Instructions (Signed)
1. Schedule repeat Neurocognitive testing with Dr. Si Raider for April 2020 2. Follow-up in 6 months, call for any changes  FALL PRECAUTIONS: Be cautious when walking. Scan the area for obstacles that may increase the risk of trips and falls. When getting up in the mornings, sit up at the edge of the bed for a few minutes before getting out of bed. Consider elevating the bed at the head end to avoid drop of blood pressure when getting up. Walk always in a well-lit room (use night lights in the walls). Avoid area rugs or power cords from appliances in the middle of the walkways. Use a walker or a cane if necessary and consider physical therapy for balance exercise. Get your eyesight checked regularly.  FINANCIAL OVERSIGHT: Supervision, especially oversight when making financial decisions or transactions is also recommended.  HOME SAFETY: Consider the safety of the kitchen when operating appliances like stoves, microwave oven, and blender. Consider having supervision and share cooking responsibilities until no longer able to participate in those. Accidents with firearms and other hazards in the house should be identified and addressed as well.  DRIVING: Regarding driving, in patients with progressive memory problems, driving will be impaired. We advise to have someone else do the driving if trouble finding directions or if minor accidents are reported. Independent driving assessment is available to determine safety of driving.  ABILITY TO BE LEFT ALONE: If patient is unable to contact 911 operator, consider using LifeLine, or when the need is there, arrange for someone to stay with patients. Smoking is a fire hazard, consider supervision or cessation. Risk of wandering should be assessed by caregiver and if detected at any point, supervision and safe proof recommendations should be instituted.  MEDICATION SUPERVISION: Inability to self-administer medication needs to be constantly addressed. Implement a  mechanism to ensure safe administration of the medications.  RECOMMENDATIONS FOR ALL PATIENTS WITH MEMORY PROBLEMS: 1. Continue to exercise (Recommend 30 minutes of walking everyday, or 3 hours every week) 2. Increase social interactions - continue going to [place] and enjoy social gatherings with friends and family 3. Eat healthy, avoid fried foods and eat more fruits and vegetables 4. Maintain adequate blood pressure, blood sugar, and blood cholesterol level. Reducing the risk of stroke and cardiovascular disease also helps promoting better memory. 5. Avoid stressful situations. Live a simple life and avoid aggravations. Organize your time and prepare for the next day in anticipation. 6. Sleep well, avoid any interruptions of sleep and avoid any distractions in the bedroom that may interfere with adequate sleep quality 7. Avoid sugar, avoid sweets as there is a strong link between excessive sugar intake, diabetes, and cognitive impairment We discussed the Mediterranean diet, which has been shown to help patients reduce the risk of progressive memory disorders and reduces cardiovascular risk. This includes eating fish, eat fruits and green leafy vegetables, nuts like almonds and hazelnuts, walnuts, and also use olive oil. Avoid fast foods and fried foods as much as possible. Avoid sweets and sugar as sugar use has been linked to worsening of memory function.  There is always a concern of gradual progression of memory problems. If this is the case, then we may need to adjust level of care according to patient needs. Support, both to the patient and caregiver, should then be put into place.

## 2017-07-10 NOTE — Telephone Encounter (Signed)
Patient was checking out with Dr. Delice Lesch today and was needing to make a April Appt with you from when she saw you last April.  She now lives in [place] and would like to come on the same day for her Interview and Testing? Would that be okay/ Please Advise. Thanks

## 2017-07-10 NOTE — Telephone Encounter (Signed)
Yes that is fine for her to do interview and testing on the same day, she can go in an older adult slot! Thanks for checking.

## 2017-07-25 ENCOUNTER — Other Ambulatory Visit: Payer: Self-pay

## 2017-07-25 NOTE — Patient Outreach (Signed)
[facility]) Care Management  07/25/2017  ADRIANNA DUDAS 03-25-1955 [id number]   Medication Adherence call to Mrs. Nathaniel Yaden left a message for patient to call back patient is due on Losartan 25 mg. [employer] has a disconnected number. Mrs. Nearhood is showing past due under [place] [employer].  [facility] Direct Dial [phone]  Fax [phone] [email or web address]

## 2017-07-31 ENCOUNTER — Other Ambulatory Visit: Payer: Self-pay | Admitting: Internal Medicine

## 2017-08-01 ENCOUNTER — Telehealth: Payer: Self-pay | Admitting: *Deleted

## 2017-08-01 NOTE — Telephone Encounter (Signed)
Received refill request of atorvastatin.  This is no longer on pts med list.     I contacted pts sister, per Dr. Ardelia Mems, to verify that med was dc'd.  Per Joaquim Lai pt is no longer on Atorvastatin. [place], Marie [name], CMA

## 2017-08-07 ENCOUNTER — Other Ambulatory Visit: Payer: Self-pay

## 2017-08-07 NOTE — Patient Outreach (Signed)
[facility]) Care Management  08/07/2017  Marie [name] January 24, 1955 [id number]   Medication Adherence to Mrs. Delta Pichon left a message for patient to call back patient is due on Losartan 25 mg. Mrs. Turek is showing past due under [facility].  [facility] Management Direct Dial [phone]  Fax [phone] [email or web address]

## 2017-08-17 ENCOUNTER — Other Ambulatory Visit: Payer: Self-pay | Admitting: Internal Medicine

## 2017-08-22 NOTE — Telephone Encounter (Signed)
Finished

## 2017-09-10 ENCOUNTER — Other Ambulatory Visit: Payer: Self-pay | Admitting: Internal Medicine

## 2017-09-11 ENCOUNTER — Telehealth: Payer: Self-pay | Admitting: Family Medicine

## 2017-09-11 NOTE — Telephone Encounter (Signed)
I called to schedule AWV and was informed that she has moved to [facility]

## 2017-09-17 ENCOUNTER — Ambulatory Visit: Payer: Medicare Other | Admitting: Family Medicine

## 2017-09-20 DIAGNOSIS — L089 Local infection of the skin and subcutaneous tissue, unspecified: Secondary | ICD-10-CM | POA: Diagnosis not present

## 2017-09-20 DIAGNOSIS — T148XXA Other injury of unspecified body region, initial encounter: Secondary | ICD-10-CM | POA: Diagnosis not present

## 2017-09-20 DIAGNOSIS — B86 Scabies: Secondary | ICD-10-CM | POA: Diagnosis not present

## 2017-09-20 DIAGNOSIS — L739 Follicular disorder, unspecified: Secondary | ICD-10-CM | POA: Diagnosis not present

## 2018-02-20 ENCOUNTER — Ambulatory Visit: Payer: Medicare Other | Admitting: Neurology

## 2018-04-04 ENCOUNTER — Encounter: Payer: Medicare Other | Admitting: Psychology
